# Patient Record
Sex: Male | Born: 1975 | Race: White | Hispanic: No | Marital: Single | Smoking: Current every day smoker
Health system: Southern US, Community
[De-identification: ages and names within clinical notes are randomized; demographics above are authoritative.]

## PROBLEM LIST (undated history)

## (undated) DIAGNOSIS — F419 Anxiety disorder, unspecified: Secondary | ICD-10-CM

## (undated) DIAGNOSIS — F32A Depression, unspecified: Secondary | ICD-10-CM

## (undated) DIAGNOSIS — F329 Major depressive disorder, single episode, unspecified: Secondary | ICD-10-CM

## (undated) DIAGNOSIS — E079 Disorder of thyroid, unspecified: Secondary | ICD-10-CM

---

## 2012-04-13 ENCOUNTER — Emergency Department (HOSPITAL_COMMUNITY)
Admission: EM | Admit: 2012-04-13 | Discharge: 2012-04-13 | Disposition: A | Discharge status: Home / Self Care | Payer: Self-pay | Attending: Emergency Medicine | Admitting: Emergency Medicine

## 2012-04-13 ENCOUNTER — Encounter (HOSPITAL_COMMUNITY): Payer: Self-pay

## 2012-04-13 DIAGNOSIS — Y929 Unspecified place or not applicable: Secondary | ICD-10-CM | POA: Insufficient documentation

## 2012-04-13 DIAGNOSIS — Z862 Personal history of diseases of the blood and blood-forming organs and certain disorders involving the immune mechanism: Secondary | ICD-10-CM | POA: Insufficient documentation

## 2012-04-13 DIAGNOSIS — Y9389 Activity, other specified: Secondary | ICD-10-CM | POA: Insufficient documentation

## 2012-04-13 DIAGNOSIS — Z8639 Personal history of other endocrine, nutritional and metabolic disease: Secondary | ICD-10-CM | POA: Insufficient documentation

## 2012-04-13 DIAGNOSIS — X500XXA Overexertion from strenuous movement or load, initial encounter: Secondary | ICD-10-CM | POA: Insufficient documentation

## 2012-04-13 DIAGNOSIS — S335XXA Sprain of ligaments of lumbar spine, initial encounter: Secondary | ICD-10-CM | POA: Insufficient documentation

## 2012-04-13 DIAGNOSIS — F17200 Nicotine dependence, unspecified, uncomplicated: Secondary | ICD-10-CM | POA: Insufficient documentation

## 2012-04-13 DIAGNOSIS — S39012A Strain of muscle, fascia and tendon of lower back, initial encounter: Secondary | ICD-10-CM

## 2012-04-13 HISTORY — DX: Disorder of thyroid, unspecified: E07.9

## 2012-04-13 MED ORDER — IBUPROFEN 400 MG PO TABS
800.0000 mg | ORAL_TABLET | Freq: Once | ORAL | Status: AC
  Administered 2012-04-13: 800 mg via ORAL
  Filled 2012-04-13: qty 2

## 2012-04-13 MED ORDER — HYDROCODONE-ACETAMINOPHEN 5-325 MG PO TABS
2.0000 | ORAL_TABLET | ORAL | Status: DC | PRN
Start: 2012-04-13 — End: 2015-08-31

## 2012-04-13 NOTE — ED Provider Notes (Signed)
History   This chart was scribed for Randy Sou, MD by Gerlean Ren, ED Scribe. This patient was seen in room TR07C/TR07C and the patient's care was started at 4:26 PM    CSN: [id number]  Arrival date & time 04/13/12  1348   First MD Initiated Contact with Patient 04/13/12 1625      Chief Complaint  Patient presents with  . Back Pain     The history is provided by the patient. No language interpreter was used.   Randy Webster is a 37 y.o. male who presents to the Emergency Department complaining of constant, mild, gradually worsening lower left back pain that occasionally radiates to the left knee with sudden onset this morning while moving a couch.  Pain worsened by movement and ambulation.  Has used tylenol and ibuprofen with minimal relief.  Pt denies incontinence.  Pt has no known allergies.  Pt is a current everyday smoker but denies alcohol use. Pain is worse with movement improved with rest. Mild at present  Past Medical History  Diagnosis Date  . Thyroid disease     History reviewed. No pertinent past surgical history.  History reviewed. No pertinent family history.  History  Substance Use Topics  . Smoking status: Current Every Day Smoker -- 0.5 packs/day    Types: Cigarettes  . Smokeless tobacco: Not on file  . Alcohol Use: No      Review of Systems  Constitutional: Negative.   HENT: Negative.   Respiratory: Negative.   Cardiovascular: Negative.   Gastrointestinal: Negative.   Musculoskeletal: Positive for back pain.  Skin: Negative.   Neurological: Negative.   Hematological: Negative.   Psychiatric/Behavioral: Negative.     Allergies  Review of patient's allergies indicates no known allergies.  Home Medications  No current outpatient prescriptions on file.  BP 112/76  Pulse 70  Temp 98 F (36.7 C) (Oral)  Resp 20  SpO2 98%  Physical Exam  Nursing note and vitals reviewed. Constitutional: He appears well-developed and well-nourished.  HENT:    Head: Normocephalic and atraumatic.  Eyes: Conjunctivae normal are normal. Pupils are equal, round, and reactive to light.  Neck: Neck supple. No tracheal deviation present. No thyromegaly present.  Cardiovascular: Normal rate and regular rhythm.   No murmur heard. Pulmonary/Chest: Effort normal and breath sounds normal.  Abdominal: Soft. Bowel sounds are normal. He exhibits no distension. There is no tenderness.  Musculoskeletal: Normal range of motion. He exhibits no edema and no tenderness.       Pain at lumbar area when standing from a supine position, no point tenderness.  Neurological: He is alert. He has normal reflexes. Coordination normal.       Gait normal  Skin: Skin is warm and dry. No rash noted.  Psychiatric: He has a normal mood and affect.    ED Course  Procedures (including critical care time) DIAGNOSTIC STUDIES: Oxygen Saturation is 98% on room air, normal by my interpretation.    COORDINATION OF CARE: 4:28 PM- Patient informed of clinical course, understands medical decision-making process, and agrees with plan.  Labs Reviewed - No data to display No results found.   No diagnosis found.    MDM  Imaging not indicated as patient has no red flexor back pain Plan prescription Norco. Referral urgent care center Diagnosis lumbar strain   I personally performed the services described in this documentation, which was scribed in my presence. The recorded information has been reviewed and is accurate.  Randy Sou, MD 04/13/12 [phone]

## 2012-04-13 NOTE — ED Notes (Signed)
Pt states he was trying to move his couch this morning and now has pain to his left lower back. NAD, ambulatory to triage

## 2015-02-13 ENCOUNTER — Encounter (HOSPITAL_COMMUNITY): Payer: Self-pay | Admitting: Family Medicine

## 2015-02-13 ENCOUNTER — Emergency Department (HOSPITAL_COMMUNITY): Payer: Self-pay

## 2015-02-13 ENCOUNTER — Emergency Department (HOSPITAL_COMMUNITY)
Admission: EM | Admit: 2015-02-13 | Discharge: 2015-02-13 | Disposition: A | Discharge status: Home / Self Care | Payer: Self-pay | Attending: Emergency Medicine | Admitting: Emergency Medicine

## 2015-02-13 DIAGNOSIS — J069 Acute upper respiratory infection, unspecified: Secondary | ICD-10-CM | POA: Insufficient documentation

## 2015-02-13 DIAGNOSIS — F17200 Nicotine dependence, unspecified, uncomplicated: Secondary | ICD-10-CM | POA: Insufficient documentation

## 2015-02-13 DIAGNOSIS — R112 Nausea with vomiting, unspecified: Secondary | ICD-10-CM | POA: Insufficient documentation

## 2015-02-13 DIAGNOSIS — Z8639 Personal history of other endocrine, nutritional and metabolic disease: Secondary | ICD-10-CM | POA: Insufficient documentation

## 2015-02-13 LAB — RAPID STREP SCREEN (MED CTR MEBANE ONLY): STREPTOCOCCUS, GROUP A SCREEN (DIRECT): NEGATIVE

## 2015-02-13 MED ORDER — AZITHROMYCIN 250 MG PO TABS: 250.0000 mg | ORAL_TABLET | Freq: Every day | ORAL | Status: DC

## 2015-02-13 NOTE — Discharge Instructions (Signed)
Mr. and Mrs. Schmid,  Nice meeting you! Please take your antibiotics as prescribed. Seek medical care if you develop fevers, have increasing throat pain, or do not improve despite antibiotics. I hope you feel better soon.  Ortencia KickS. Nicole Atasha Colebank, PA-C

## 2015-02-13 NOTE — ED Provider Notes (Signed)
CSN: [id number]     Arrival date & time 02/13/15  1106 History   First MD Initiated Contact with Patient 02/13/15 1157     Chief Complaint  Patient presents with  . Sore Throat  . Nausea  . Emesis   HPI   Randy Webster is a 39 y.o. M PMH significant for hypothyroidism presenting with a 2 day history of sore throat. He endorses fever (never measured), chills, nasal congestion, nonproductive cough, wheezing, nausea with vomiting (2 episodes a few days ago, NBNB) for two weeks. He states his nausea only occurs when he lays down. He denies HA, ear pain, abdominal pain, diarrhea, raw/undercooked foods, recent travel, ill contacts. Mucinex has not provided relief. No other alleviation attempts.  Past Medical History  Diagnosis Date  . Thyroid disease    History reviewed. No pertinent past surgical history. History reviewed. No pertinent family history. Social History  Substance Use Topics  . Smoking status: Current Every Day Smoker -- 0.50 packs/day    Types: Cigarettes  . Smokeless tobacco: None  . Alcohol Use: No    Review of Systems  Ten systems are reviewed and are negative for acute change except as noted in the HPI  Allergies  Review of patient's allergies indicates no known allergies.  Home Medications   Prior to Admission medications   Medication Sig Start Date End Date Taking? Authorizing Provider  acetaminophen (TYLENOL) 500 MG tablet Take 500 mg by mouth every 6 (six) hours as needed. For pain    Historical Provider, MD  azithromycin (ZITHROMAX) 250 MG tablet Take 1 tablet (250 mg total) by mouth daily. Take first 2 tablets together, then 1 every day until finished. 02/13/15   Melton Krebs, PA-C  HYDROcodone-acetaminophen (NORCO/VICODIN) 5-325 MG per tablet Take 2 tablets by mouth every 4 (four) hours as needed for pain. 04/13/12   Doug Sou, MD  ibuprofen (ADVIL,MOTRIN) 200 MG tablet Take 600 mg by mouth every 6 (six) hours as needed. For pain    Historical  Provider, MD   BP 115/86 mmHg  Pulse 74  Temp(Src) 98.5 F (36.9 C)  Resp 18  Wt 204 lb (92.534 kg)  SpO2 97% Physical Exam  Constitutional: He appears well-developed and well-nourished. No distress.  HENT:  Head: Normocephalic and atraumatic.  Right Ear: External ear normal.  Left Ear: External ear normal.  Mouth/Throat: Oropharynx is clear and moist. No oropharyngeal exudate.  Swollen, erythematous turbinates bilaterally. Oropharynx erythematous without exudate. Airway intact.   Eyes: Conjunctivae are normal. Pupils are equal, round, and reactive to light. Right eye exhibits no discharge. Left eye exhibits no discharge. No scleral icterus.  Neck: Normal range of motion. No tracheal deviation present.  Cardiovascular: Normal rate, regular rhythm, normal heart sounds and intact distal pulses.  Exam reveals no gallop and no friction rub.   No murmur heard. Pulmonary/Chest: Effort normal and breath sounds normal. No respiratory distress. He has no wheezes. He has no rales. He exhibits no tenderness.  Abdominal: Soft. Bowel sounds are normal. He exhibits no distension and no mass. There is no tenderness. There is no rebound and no guarding.  Musculoskeletal: He exhibits no edema.  Lymphadenopathy:    He has no cervical adenopathy.  Neurological: He is alert. Coordination normal.  Skin: Skin is warm and dry. No rash noted. He is not diaphoretic. No erythema.  Psychiatric: He has a normal mood and affect. His behavior is normal.  Nursing note and vitals reviewed.   ED Course  Procedures  Labs Review Labs Reviewed  RAPID STREP SCREEN (NOT AT [facility])  CULTURE, GROUP A STREP   Imaging Review Dg Chest 2 View  02/13/2015  CLINICAL DATA:  Cough for 3 days with shortness of Breath EXAM: CHEST - 2 VIEW COMPARISON:  None. FINDINGS: The heart size and mediastinal contours are within normal limits. Both lungs are clear. The visualized skeletal structures are unremarkable. IMPRESSION: No  active disease. Electronically Signed   By: Alcide CleverMark  Lukens M.D.   On: 02/13/2015 13:48   I have personally reviewed and evaluated these images and lab results as part of my medical decision-making.  MDM   Final diagnoses:  Upper respiratory infection   Patient non-toxic appearing with stable vital signs. Patient admits to smoking history, and due to longevity of symptoms, will get a CXR. Most likely URI. No stridor, respiratory distress, drooling, muffled voice, masses visualized in pharynx. Supraglottitis/epiglottitis, pharyngeal abscess less likely.   Reviewed RN note regarding non-compliance with thyroid medication. Patient has been off of his thyroid medication (thinks it is synthroid), for a year and a half due to lack of steady income. I am uncomfortable prescribing anything for this, it is not an acute issue, and TSH testing is not indicated during this visit. Encouraged patient to follow-up with PCP regarding future work-up.  CXR negative for acute process. Discussed results with patient. Due to longevity of symptoms and smoking history, will prescribe z-pack. Advised ibuprofen and Tylenol for pain management. Patient may be safely discharged home. Discussed reasons for return. Patient to follow-up with primary care provider within one week. Patient in understanding and agreement with the plan.  Melton KrebsSamantha Nicole Rhanda Lemire, PA-C 02/15/15 [id number]  Geoffery Lyonsouglas Delo, MD 02/18/15 [phone]

## 2015-02-13 NOTE — ED Notes (Signed)
Pt here for sore throat with N,V. sts he has been off his thyroid meds.

## 2015-02-13 NOTE — ED Notes (Signed)
Pt placed on monitor upon return to room from radiology. Pt remains monitored by blood pressure and pulse ox. Pts family remains at bedside.  

## 2015-02-15 LAB — CULTURE, GROUP A STREP: Strep A Culture: NEGATIVE

## 2015-08-16 ENCOUNTER — Emergency Department (HOSPITAL_COMMUNITY)
Admission: EM | Admit: 2015-08-16 | Discharge: 2015-08-16 | Disposition: A | Discharge status: Home / Self Care | Payer: Self-pay | Attending: Emergency Medicine | Admitting: Emergency Medicine

## 2015-08-16 ENCOUNTER — Encounter (HOSPITAL_COMMUNITY): Payer: Self-pay | Admitting: Emergency Medicine

## 2015-08-16 DIAGNOSIS — Z8639 Personal history of other endocrine, nutritional and metabolic disease: Secondary | ICD-10-CM | POA: Insufficient documentation

## 2015-08-16 DIAGNOSIS — X58XXXA Exposure to other specified factors, initial encounter: Secondary | ICD-10-CM | POA: Insufficient documentation

## 2015-08-16 DIAGNOSIS — Y9289 Other specified places as the place of occurrence of the external cause: Secondary | ICD-10-CM | POA: Insufficient documentation

## 2015-08-16 DIAGNOSIS — Y998 Other external cause status: Secondary | ICD-10-CM | POA: Insufficient documentation

## 2015-08-16 DIAGNOSIS — F17210 Nicotine dependence, cigarettes, uncomplicated: Secondary | ICD-10-CM | POA: Insufficient documentation

## 2015-08-16 DIAGNOSIS — Y9389 Activity, other specified: Secondary | ICD-10-CM | POA: Insufficient documentation

## 2015-08-16 DIAGNOSIS — S39012A Strain of muscle, fascia and tendon of lower back, initial encounter: Secondary | ICD-10-CM | POA: Insufficient documentation

## 2015-08-16 MED ORDER — METHOCARBAMOL 500 MG PO TABS: 500.0000 mg | ORAL_TABLET | Freq: Two times a day (BID) | ORAL | Status: DC

## 2015-08-16 MED ORDER — IBUPROFEN 800 MG PO TABS: 800.0000 mg | ORAL_TABLET | Freq: Three times a day (TID) | ORAL | Status: DC

## 2015-08-16 NOTE — Discharge Instructions (Signed)

## 2015-08-16 NOTE — ED Provider Notes (Signed)
History  By signing my name below, I, Randy Webster, attest that this documentation has been prepared under the direction and in the presence of Randy Helper, PA-C. Electronically Signed: Earmon Webster, ED Scribe. 08/16/2015. 2:34 PM.  Chief Complaint  Patient presents with  . Back Pain   The history is provided by the patient and medical records. No language interpreter was used.    HPI Comments:  Randy Webster is a 40 y.o. male who presents to the Emergency Department complaining of right-sided lower back pain that began two days ago. He states the pain radiates down his RLE to the knee. Pt states the day before the pain began he moved a dresser a very short distance. He has taken OTC Ibuprofen 3-4 tablets every 4-6 hours with minimal relief of the pain. Movement increases the pain. He denies alleviating factors. He denies difficulty urinating, dysuria, hematuria, fever, chills, abdominal pain, nausea, vomiting, abdominal pain, bowel or bladder incontinence, numbness, tingling or weakness of the lower extremities, saddle anesthesia. He reports back pain in the past that was controlled with OTC medications. He denies h/o kidney stones, cancer or IV drug use.  Past Medical History  Diagnosis Date  . Thyroid disease    History reviewed. No pertinent past surgical history. No family history on file. Social History  Substance Use Topics  . Smoking status: Current Every Day Smoker -- 0.50 packs/day    Types: Cigarettes  . Smokeless tobacco: None  . Alcohol Use: No    Review of Systems  Constitutional: Negative for fever and chills.  Gastrointestinal: Negative for nausea, vomiting and abdominal pain.  Genitourinary: Negative for dysuria, hematuria and difficulty urinating.  Musculoskeletal: Positive for back pain.  Skin: Negative for color change and wound.  Neurological: Negative for weakness and numbness.    Allergies  Review of patient's allergies indicates no known  allergies.  Home Medications   Prior to Admission medications   Medication Sig Start Date End Date Taking? Authorizing Provider  acetaminophen (TYLENOL) 500 MG tablet Take 500 mg by mouth every 6 (six) hours as needed. For pain    Historical Provider, MD  azithromycin (ZITHROMAX) 250 MG tablet Take 1 tablet (250 mg total) by mouth daily. Take first 2 tablets together, then 1 every day until finished. 02/13/15   Melton Krebs, PA-C  HYDROcodone-acetaminophen (NORCO/VICODIN) 5-325 MG per tablet Take 2 tablets by mouth every 4 (four) hours as needed for pain. 04/13/12   Doug Sou, MD  ibuprofen (ADVIL,MOTRIN) 200 MG tablet Take 600 mg by mouth every 6 (six) hours as needed. For pain    Historical Provider, MD   Triage Vitals: BP 140/95 mmHg  Pulse 87  Temp(Src) 98.5 F (36.9 C) (Oral)  Resp 18  SpO2 98% Physical Exam  Constitutional: He is oriented to person, place, and time. He appears well-developed and well-nourished.  HENT:  Head: Normocephalic and atraumatic.  Eyes: EOM are normal.  Neck: Normal range of motion.  Cardiovascular: Normal rate.   Pulmonary/Chest: Effort normal.  Abdominal: Soft. There is no tenderness. There is no CVA tenderness.  Musculoskeletal: Normal range of motion.  No significant spine tenderness, crepitus or step offs. Right paraspinal lumbar tenderness to palpation. Skin normal. Equal strength to BLE. No palpable cords, erythema or edema. Negative SLR bilaterally.  Neurological: He is alert and oriented to person, place, and time.  Patellar reflexes intact bilaterally.  Skin: Skin is warm and dry.  Psychiatric: He has a normal mood and affect. His behavior  is normal.  Nursing note and vitals reviewed.   ED Course  Procedures (including critical care time) DIAGNOSTIC STUDIES: Oxygen Saturation is 98% on RA, normal by my interpretation.   COORDINATION OF CARE: 2:27 PM- low back strain.  No red flags. Ambulate without difficulty.  NVI.  Will  prescribe muscle relaxer and give referral to orthopedist. Pt verbalizes understanding and agrees to plan.   MDM   Final diagnoses:  Low back strain, initial encounter    BP 140/95 mmHg  Pulse 87  Temp(Src) 98.5 F (36.9 C) (Oral)  Resp 18  SpO2 98%   I personally performed the services described in this documentation, which was scribed in my presence. The recorded information has been reviewed and is accurate.       Randy HelperBowie Aidenn Skellenger, PA-C 08/16/15 1439  Melene Planan Floyd, DO 08/16/15 1506

## 2015-08-16 NOTE — ED Notes (Signed)
Pt reports right side lower back pain x 2 days. Pt alert x4.

## 2015-08-31 ENCOUNTER — Emergency Department (HOSPITAL_COMMUNITY)
Admission: EM | Admit: 2015-08-31 | Discharge: 2015-08-31 | Disposition: A | Discharge status: Home / Self Care | Payer: Self-pay | Attending: Emergency Medicine | Admitting: Emergency Medicine

## 2015-08-31 ENCOUNTER — Emergency Department (HOSPITAL_COMMUNITY): Payer: Self-pay

## 2015-08-31 ENCOUNTER — Encounter (HOSPITAL_COMMUNITY): Payer: Self-pay | Admitting: Emergency Medicine

## 2015-08-31 DIAGNOSIS — Z79899 Other long term (current) drug therapy: Secondary | ICD-10-CM | POA: Insufficient documentation

## 2015-08-31 DIAGNOSIS — N201 Calculus of ureter: Secondary | ICD-10-CM

## 2015-08-31 DIAGNOSIS — F17210 Nicotine dependence, cigarettes, uncomplicated: Secondary | ICD-10-CM | POA: Insufficient documentation

## 2015-08-31 DIAGNOSIS — N132 Hydronephrosis with renal and ureteral calculous obstruction: Secondary | ICD-10-CM | POA: Insufficient documentation

## 2015-08-31 DIAGNOSIS — N1330 Unspecified hydronephrosis: Secondary | ICD-10-CM

## 2015-08-31 LAB — I-STAT CHEM 8, ED
BUN: 11 mg/dL (ref 6–20)
CALCIUM ION: 1.12 mmol/L (ref 1.12–1.23)
CHLORIDE: 102 mmol/L (ref 101–111)
CREATININE: 1.8 mg/dL — AB (ref 0.61–1.24)
GLUCOSE: 85 mg/dL (ref 65–99)
HCT: 35 % — ABNORMAL LOW (ref 39.0–52.0)
Hemoglobin: 11.9 g/dL — ABNORMAL LOW (ref 13.0–17.0)
Potassium: 3.8 mmol/L (ref 3.5–5.1)
Sodium: 138 mmol/L (ref 135–145)
TCO2: 24 mmol/L (ref 0–100)

## 2015-08-31 LAB — URINE MICROSCOPIC-ADD ON

## 2015-08-31 LAB — URINALYSIS, ROUTINE W REFLEX MICROSCOPIC
Glucose, UA: NEGATIVE mg/dL
Leukocytes, UA: NEGATIVE
NITRITE: NEGATIVE
PROTEIN: 30 mg/dL — AB
SPECIFIC GRAVITY, URINE: 1.028 (ref 1.005–1.030)
pH: 5.5 (ref 5.0–8.0)

## 2015-08-31 MED ORDER — MORPHINE SULFATE (PF) 4 MG/ML IV SOLN
4.0000 mg | Freq: Once | INTRAVENOUS | Status: AC
  Administered 2015-08-31: 4 mg via INTRAVENOUS
  Filled 2015-08-31: qty 1

## 2015-08-31 MED ORDER — SODIUM CHLORIDE 0.9 % IV BOLUS (SEPSIS)
1000.0000 mL | Freq: Once | INTRAVENOUS | Status: AC
  Administered 2015-08-31: 1000 mL via INTRAVENOUS

## 2015-08-31 MED ORDER — OXYCODONE-ACETAMINOPHEN 5-325 MG PO TABS: 1.0000 | ORAL_TABLET | ORAL | Status: DC | PRN

## 2015-08-31 MED ORDER — TAMSULOSIN HCL 0.4 MG PO CAPS: 0.4000 mg | ORAL_CAPSULE | Freq: Every day | ORAL | Status: DC

## 2015-08-31 MED ORDER — IBUPROFEN 800 MG PO TABS: 800.0000 mg | ORAL_TABLET | Freq: Three times a day (TID) | ORAL | Status: DC | PRN

## 2015-08-31 MED ORDER — HYDROMORPHONE HCL 1 MG/ML IJ SOLN
0.5000 mg | Freq: Once | INTRAMUSCULAR | Status: AC
  Administered 2015-08-31: 0.5 mg via INTRAVENOUS
  Filled 2015-08-31: qty 1

## 2015-08-31 MED ORDER — ONDANSETRON HCL 4 MG PO TABS: 4.0000 mg | ORAL_TABLET | Freq: Four times a day (QID) | ORAL | Status: DC

## 2015-08-31 MED ORDER — KETOROLAC TROMETHAMINE 30 MG/ML IJ SOLN
30.0000 mg | Freq: Once | INTRAMUSCULAR | Status: AC
  Administered 2015-08-31: 30 mg via INTRAVENOUS
  Filled 2015-08-31: qty 1

## 2015-08-31 NOTE — ED Provider Notes (Signed)
CSN: [id number]     Arrival date & time 08/31/15  1011 History   First MD Initiated Contact with Patient 08/31/15 1032     Chief Complaint  Patient presents with  . Flank Pain     (Consider location/radiation/quality/duration/timing/severity/associated sxs/prior Treatment) HPI Comments: 40 year old male with history of kidney stones presents for right flank pain. The patient reports that he's been having pain in this area over the last 3 days. He says he was hoping that it would pass on its own as he has had kidney stones in the past that passed on their own. Denies fever or chills. Has had some nausea but no vomiting. No diarrhea or constipation.   Past Medical History  Diagnosis Date  . Thyroid disease    History reviewed. No pertinent past surgical history. History reviewed. No pertinent family history. Social History  Substance Use Topics  . Smoking status: Current Every Day Smoker -- 0.50 packs/day    Types: Cigarettes  . Smokeless tobacco: None  . Alcohol Use: No    Review of Systems  Constitutional: Negative for fever, chills, appetite change and fatigue.  HENT: Negative for congestion, postnasal drip, rhinorrhea and sinus pressure.   Eyes: Negative for visual disturbance.  Respiratory: Negative for cough, chest tightness and shortness of breath.   Cardiovascular: Negative for chest pain and palpitations.  Gastrointestinal: Positive for nausea. Negative for vomiting, abdominal pain and diarrhea.  Genitourinary: Positive for flank pain. Negative for urgency, frequency and hematuria.  Musculoskeletal: Negative for myalgias and back pain.  Skin: Negative for rash.  Neurological: Negative for dizziness, weakness and headaches.  Hematological: Does not bruise/bleed easily.      Allergies  Review of patient's allergies indicates no known allergies.  Home Medications   Prior to Admission medications   Medication Sig Start Date End Date Taking? Authorizing Provider   ibuprofen (ADVIL,MOTRIN) 800 MG tablet Take 1 tablet (800 mg total) by mouth every 8 (eight) hours as needed for moderate pain. 08/31/15   Leta Baptist, MD  methocarbamol (ROBAXIN) 500 MG tablet Take 1 tablet (500 mg total) by mouth 2 (two) times daily. Patient not taking: Reported on 08/31/2015 08/16/15   Fayrene Helper, PA-C  oxyCODONE-acetaminophen (PERCOCET/ROXICET) 5-325 MG tablet Take 1-2 tablets by mouth every 4 (four) hours as needed for moderate pain or severe pain. 08/31/15   Leta Baptist, MD  tamsulosin (FLOMAX) 0.4 MG CAPS capsule Take 1 capsule (0.4 mg total) by mouth daily. 08/31/15   Leta Baptist, MD   BP 140/90 mmHg  Pulse 62  Temp(Src) 98.4 F (36.9 C) (Oral)  Resp 18  SpO2 98% Physical Exam  Constitutional: He is oriented to person, place, and time. He appears well-developed and well-nourished. No distress.  HENT:  Head: Normocephalic and atraumatic.  Right Ear: External ear normal.  Left Ear: External ear normal.  Mouth/Throat: Oropharynx is clear and moist. No oropharyngeal exudate.  Eyes: EOM are normal. Pupils are equal, round, and reactive to light.  Neck: Normal range of motion. Neck supple.  Cardiovascular: Normal rate, regular rhythm, normal heart sounds and intact distal pulses.   No murmur heard. Pulmonary/Chest: Effort normal. No respiratory distress. He has no wheezes. He has no rales.  Abdominal: Soft. He exhibits no distension. There is no tenderness.  Musculoskeletal: He exhibits no edema.  Neurological: He is alert and oriented to person, place, and time.  Skin: Skin is warm and dry. No rash noted. He is not diaphoretic.  Vitals reviewed.  ED Course  Procedures (including critical care time) Labs Review Labs Reviewed  URINALYSIS, ROUTINE W REFLEX MICROSCOPIC (NOT AT [facility]) - Abnormal; Notable for the following:    APPearance CLOUDY (*)    Hgb urine dipstick LARGE (*)    Bilirubin Urine SMALL (*)    Ketones, ur >80 (*)    Protein, ur 30 (*)     All other components within normal limits  URINE MICROSCOPIC-ADD ON - Abnormal; Notable for the following:    Squamous Epithelial / LPF 0-5 (*)    Bacteria, UA RARE (*)    All other components within normal limits  I-STAT CHEM 8, ED - Abnormal; Notable for the following:    Creatinine, Ser 1.80 (*)    Hemoglobin 11.9 (*)    HCT 35.0 (*)    All other components within normal limits    Imaging Review US Renal  08/31/2015  CLINICAL DATA:  Acute right flank pain. EXAM: RENAL / URINARY TRACT ULTRASOUND COMPLETE COMPARISON:  None. FINDINGS: Right Kidney: Length: 13 cm. Echogenicity within normal limits. No mass visualized. Moderate hydronephrosis is noted. No obstructing calculus is seen. Left Kidney: Length: 12.9 cm. 5 mm nonobstructive calculus is seen in midpole calyx. Echogenicity within normal limits. No mass or hydronephrosis visualized. Bladder: Appears normal for degree of bladder distention. IMPRESSION: Moderate right hydronephrosis. Nonobstructive left renal calculus. CT urogram is recommended for further evaluation. Electronically Signed   By: Lupita Raider, M.D.   On: 08/31/2015 12:47   Ct Renal Stone Study  08/31/2015  CLINICAL DATA:  Acute onset right flank pain in patient with history of urinary tract stones. Initial encounter. EXAM: CT ABDOMEN AND PELVIS WITHOUT CONTRAST TECHNIQUE: Multidetector CT imaging of the abdomen and pelvis was performed following the standard protocol without IV contrast. COMPARISON:  None. FINDINGS: A 0.6 cm nodule is seen in the right middle lobe on image 8. Dependent bibasilar atelectasis is noted. No pleural or pericardial effusion. The patient has multiple bilateral nonobstructing renal stones measuring up to 0.6 cm in the left kidney. Moderate to moderately severe right hydronephrosis is identified due to a 0.5 cm stone just below the right ureteropelvic junction. No other obstructing stones are identified. The urinary bladder is unremarkable. The  gallbladder, liver, spleen, adrenal glands and pancreas appear normal. Aortoiliac atherosclerosis without aneurysm is identified. There is no lymphadenopathy or fluid. The stomach, small and large bowel and appendix appear normal. Schmorl's nodes are noted in the lower thoracic and upper lumbar spine. No worrisome bony lesion is identified. IMPRESSION: Moderate to moderately severe right hydronephrosis due to a 0.5 cm stone just below the right ureteropelvic junction. Bilateral nonobstructing renal stones measuring up to 0.6 cm. 0.6 cm nodule in the right middle lobe. Non-contrast chest CT at 6-12 months is recommended. If the nodule is stable at time of repeat CT, then future CT at 18-24 months (from today's scan) is considered optional for low-risk patients, but is recommended for high-risk patients. This recommendation follows the consensus statement: Guidelines for Management of Incidental Pulmonary Nodules Detected on CT Images:From the Fleischner Society 2017; published online before print (10.1148/radiol.1610960454). Advanced for age aortoiliac atherosclerosis. Electronically Signed   By: Drusilla Kanner M.D.   On: 08/31/2015 14:09   I have personally reviewed and evaluated these images and lab results as part of my medical decision-making.   EKG Interpretation None      MDM  Patient was seen and evaluated in stable condition. Benign examination. History concerning for kidney  stone. Chem 8 with creatinine of 1.8. No previous creatinines to compare this to. Urine not consistent with infection. Ultrasound with moderate Hydro. Follow-up CT with 5 mm obstructing stone. Case discussed briefly on the phone with urologist on-call who agreed with plan for discharge with pain control. The urologist also recommended the patient follow up with the resident clinic at at their office. This was discussed with the patient since finances will be an issue for him. Discussed all results and plan of care with patient  and his family at bedside. They expressed understanding and agreement. Final diagnoses:  Ureteral calculus  Hydronephrosis, right    1. Renal colic  2. Ureteral stone  3. Hydronephrosis    Leta BaptistEmily Roe Curstin Schmale, MD 08/31/15 1453

## 2015-08-31 NOTE — ED Notes (Signed)
Bed: [id number] Expected date:  Expected time:  Means of arrival:  Comments: 1F/abd pain/hx of diverticulitis

## 2015-08-31 NOTE — ED Notes (Signed)
MD at bedside. 

## 2015-08-31 NOTE — ED Notes (Signed)
Delay in blood draw-patient in ultrasound

## 2015-08-31 NOTE — Discharge Instructions (Signed)
You were seen and evaluated today for your right-sided pain. This is secondary to a kidney stone that is blocking outflow of urine from your kidney. You need to follow-up with the urologist outpatient. Use the pain medication prescribed. The urology clinic has a resident clinic that you should arrange follow-up with as it apparently will not cause any money. Return with fever, intractable vomiting.   Renal Colic Renal colic is pain that is caused by passing a kidney stone. The pain can be sharp and severe. It may be felt in the back, abdomen, side (flank), or groin. It can cause nausea. Renal colic can come and go. HOME CARE INSTRUCTIONS Watch your condition for any changes. The following actions may help to lessen any discomfort that you are feeling:  Take medicines only as directed by your health care provider.  Ask your health care provider if it is okay to take over-the-counter pain medicine.  Drink enough fluid to keep your urine clear or pale yellow. Drink 6-8 glasses of water each day.  Limit the amount of salt that you eat to less than 2 grams per day.  Reduce the amount of protein in your diet. Eat less meat, fish, nuts, and dairy.  Avoid foods such as spinach, rhubarb, nuts, or bran. These may make kidney stones more likely to form. SEEK MEDICAL CARE IF:  You have a fever or chills.  Your urine smells bad or looks cloudy.  You have pain or burning when you pass urine. SEEK IMMEDIATE MEDICAL CARE IF:  Your flank pain or groin pain suddenly worsens.  You become confused or disoriented or you lose consciousness.   This information is not intended to replace advice given to you by your health care provider. Make sure you discuss any questions you have with your health care provider.   Document Released: 12/26/2004 Document Revised: 04/08/2014 Document Reviewed: 01/26/2014 Elsevier Interactive Patient Education [employer]

## 2015-08-31 NOTE — ED Notes (Signed)
Hx of kidney stones, right lower flank pain, no blood in urine but urine has been dark recently, says he feels like its a kidney stone. C/o nausea and vomiting, denies diarrhea.

## 2015-08-31 NOTE — ED Notes (Signed)
Patient remains in ultrasound.

## 2017-10-11 ENCOUNTER — Encounter (HOSPITAL_COMMUNITY): Payer: Self-pay | Admitting: Emergency Medicine

## 2017-10-11 ENCOUNTER — Emergency Department (HOSPITAL_COMMUNITY): Payer: Self-pay

## 2017-10-11 ENCOUNTER — Emergency Department (HOSPITAL_COMMUNITY)
Admission: EM | Admit: 2017-10-11 | Discharge: 2017-10-11 | Disposition: A | Discharge status: Home / Self Care | Payer: Self-pay | Attending: Emergency Medicine | Admitting: Emergency Medicine

## 2017-10-11 DIAGNOSIS — Z79899 Other long term (current) drug therapy: Secondary | ICD-10-CM | POA: Insufficient documentation

## 2017-10-11 DIAGNOSIS — R0609 Other forms of dyspnea: Secondary | ICD-10-CM | POA: Insufficient documentation

## 2017-10-11 DIAGNOSIS — R600 Localized edema: Secondary | ICD-10-CM

## 2017-10-11 DIAGNOSIS — F17210 Nicotine dependence, cigarettes, uncomplicated: Secondary | ICD-10-CM | POA: Insufficient documentation

## 2017-10-11 DIAGNOSIS — R2243 Localized swelling, mass and lump, lower limb, bilateral: Secondary | ICD-10-CM | POA: Insufficient documentation

## 2017-10-11 LAB — CBC
HEMATOCRIT: 40.8 % (ref 39.0–52.0)
HEMOGLOBIN: 14.1 g/dL (ref 13.0–17.0)
MCH: 33.3 pg (ref 26.0–34.0)
MCHC: 34.6 g/dL (ref 30.0–36.0)
MCV: 96.2 fL (ref 78.0–100.0)
Platelets: 217 10*3/uL (ref 150–400)
RBC: 4.24 MIL/uL (ref 4.22–5.81)
RDW: 14.8 % (ref 11.5–15.5)
WBC: 8.1 10*3/uL (ref 4.0–10.5)

## 2017-10-11 LAB — COMPREHENSIVE METABOLIC PANEL
ALBUMIN: 3.1 g/dL — AB (ref 3.5–5.0)
ALK PHOS: 29 U/L — AB (ref 38–126)
ALT: 32 U/L (ref 0–44)
AST: 39 U/L (ref 15–41)
Anion gap: 6 (ref 5–15)
BILIRUBIN TOTAL: 0.5 mg/dL (ref 0.3–1.2)
BUN: 13 mg/dL (ref 6–20)
CALCIUM: 8.1 mg/dL — AB (ref 8.9–10.3)
CO2: 25 mmol/L (ref 22–32)
CREATININE: 1.25 mg/dL — AB (ref 0.61–1.24)
Chloride: 110 mmol/L (ref 98–111)
GFR calc Af Amer: >60 mL/min (ref >60)
GFR calc non Af Amer: >60 mL/min (ref >60)
GLUCOSE: 77 mg/dL (ref 70–99)
Potassium: 3.1 mmol/L — ABNORMAL LOW (ref 3.5–5.1)
Sodium: 141 mmol/L (ref 135–145)
TOTAL PROTEIN: 5.3 g/dL — AB (ref 6.5–8.1)

## 2017-10-11 LAB — TSH: TSH: 171.275 u[IU]/mL — ABNORMAL HIGH (ref 0.350–4.500)

## 2017-10-11 LAB — BRAIN NATRIURETIC PEPTIDE: B NATRIURETIC PEPTIDE 5: 28.9 pg/mL (ref 0.0–100.0)

## 2017-10-11 LAB — TROPONIN I: Troponin I: <0.03 ng/mL (ref <0.03)

## 2017-10-11 MED ORDER — FUROSEMIDE 40 MG PO TABS
20.0000 mg | ORAL_TABLET | Freq: Once | ORAL | Status: AC
  Administered 2017-10-11: 20 mg via ORAL
  Filled 2017-10-11: qty 1

## 2017-10-11 MED ORDER — POTASSIUM CHLORIDE CRYS ER 20 MEQ PO TBCR: 20.0000 meq | EXTENDED_RELEASE_TABLET | Freq: Two times a day (BID) | ORAL | 0 refills | Status: DC

## 2017-10-11 MED ORDER — FUROSEMIDE 20 MG PO TABS: 20.0000 mg | ORAL_TABLET | Freq: Every day | ORAL | 0 refills | Status: DC

## 2017-10-11 MED ORDER — POTASSIUM CHLORIDE CRYS ER 20 MEQ PO TBCR
40.0000 meq | EXTENDED_RELEASE_TABLET | Freq: Once | ORAL | Status: AC
  Administered 2017-10-11: 40 meq via ORAL
  Filled 2017-10-11: qty 2

## 2017-10-11 NOTE — Discharge Instructions (Addendum)
Thank you for allowing me to care for you today in the Emergency Department.   Starting tomorrow, take one tablet of Lasix daily as needed for leg swelling. Take 2 tablets of potassium chloride daily.   Follow up with Chales AbrahamsMary Ann Placey this week.   Return to the Emergency Department with if you develop chest pain or shortness of breath that does not improve with rest, if one legs get red and hot to the touch, vomiting, or other new, concerning symptoms.

## 2017-10-11 NOTE — ED Provider Notes (Signed)
[facility] DEPT Provider Note   CSN: [id number] Arrival date & time: 10/11/17  1546     History   Chief Complaint Chief Complaint  Patient presents with  . Leg Swelling  . Leg Pain    HPI Randy Webster is a 42 y.o. male with a history of thyroid disease who presents to the emergency department with a chief complaint of bilateral leg swelling.  The patient endorses bilateral swelling to the lower extremities and dyspnea on exertion that has been gradually worsening since onset in November 2018.  Reports that dyspnea is only present with exertion after approximately 2 blocks.  No change in distance with onset of exertion.  Symptoms resolve with rest.   He reports that he and his wife are currently homeless.  He is not established with a PCP.  He denies chest pain, palpitations, fever, chills, numbness, weakness, abdominal pain, headache, dizziness, or lightheadedness.  No orthopnea.  No recent abdominal distention or weight gain.  He states that he is actually lost some weight as he has not been eating as well since they became homeless.  He reports that he was seen at a hospital in [place] in 2013 and had a cardiac catheterization performed.  No stents or CABG were performed at that time.  He is unable to recall the cardiologist that performed to the procedure.  He has not been seen by cardiology since that time.  He does report multiple family members with CAD.  His wife also notes that it seems that he has had snoring and some brief periods where it appears as if he is not breathing when he sleeps at night.  No problems with apnea during the day.  His wife also states that there are some areas on his legs where he no longer has hair. He denies pain in his legs.  No history of claudication.  Is a current, every day smoker.  He reports that he was previously taking levothyroxine for thyroid disease, but has not taken the medication for years.  He currently takes  no daily medications.  He reports infrequent and minimal alcohol use.  Last alcohol use was 3 beers 2 weeks ago.  The history is provided by the patient. No language interpreter was used.    Past Medical History:  Diagnosis Date  . Thyroid disease     There are no active problems to display for this patient.   History reviewed. No pertinent surgical history.      Home Medications    Prior to Admission medications   Medication Sig Start Date End Date Taking? Authorizing Provider  camphor-menthol Timoteo Ace) lotion Apply 1 application topically 2 (two) times daily.   Yes [provider]  ibuprofen (ADVIL,MOTRIN) 200 MG tablet Take 800 mg by mouth every 4 (four) hours as needed for mild pain.   Yes [provider]  furosemide (LASIX) 20 MG tablet Take 1 tablet (20 mg total) by mouth daily. 10/11/17   Damani Kelemen A, PA-C  potassium chloride SA (K-DUR,KLOR-CON) 20 MEQ tablet Take 1 tablet (20 mEq total) by mouth 2 (two) times daily for 7 days. 10/11/17 10/18/17  Britain Anagnos, Maree Erie A, PA-C    Family History No family history on file.  Social History Social History   Tobacco Use  . Smoking status: Current Every Day Smoker    Packs/day: 0.50    Types: Cigarettes  . Smokeless tobacco: Never Used  Substance Use Topics  . Alcohol use: No  .  Drug use: No     Allergies   Patient has no known allergies.   Review of Systems Review of Systems  Constitutional: Negative for appetite change, fever and unexpected weight change.  Respiratory: Positive for shortness of breath.   Cardiovascular: Positive for leg swelling. Negative for chest pain and palpitations.  Gastrointestinal: Negative for abdominal pain, diarrhea, nausea and vomiting.  Genitourinary: Negative for dysuria.  Musculoskeletal: Negative for back pain and myalgias.  Skin: Negative for rash.  Allergic/Immunologic: Negative for immunocompromised state.  Neurological: Negative for dizziness, syncope,  weakness, numbness and headaches.  Psychiatric/Behavioral: Negative for confusion.   Physical Exam Updated Vital Signs BP (!) 131/96   Pulse 69   Temp (!) 97.2 F (36.2 C) (Oral)   Resp 20   SpO2 96%   Physical Exam  Constitutional: He appears well-developed. No distress.  Well-appearing, no acute distress.  HENT:  Head: Normocephalic.  Eyes: Pupils are equal, round, and reactive to light. Conjunctivae and EOM are normal. Right eye exhibits no discharge. Left eye exhibits no discharge. No scleral icterus.  Neck: Neck supple.  Cardiovascular: Normal rate, regular rhythm, normal heart sounds and intact distal pulses. Exam reveals no gallop and no friction rub.  No murmur heard. Pulmonary/Chest: Effort normal and breath sounds normal. No stridor. No respiratory distress. He has no wheezes. He has no rales. He exhibits no tenderness.  Lungs are clear to auscultation bilaterally.  Speaks in complete, fluent sentences.  No increased work of breathing, accessory muscle use, retractions.  Abdominal: Soft. He exhibits no distension and no mass. There is no tenderness. There is no rebound and no guarding. No hernia.  Abdomen is minimally distended, but soft.  Nontender to palpation.   Musculoskeletal: Normal range of motion. He exhibits edema. He exhibits no tenderness or deformity.  2+ pitting edema in the bilateral lower extremities that does not extend past the mid-calf.   Neurological: He is alert.  Skin: Skin is warm and dry. He is not diaphoretic.  Psychiatric: His behavior is normal.  Nursing note and vitals reviewed.  ED Treatments / Results  Labs (all labs ordered are listed, but only abnormal results are displayed) Labs Reviewed  COMPREHENSIVE METABOLIC PANEL - Abnormal; Notable for the following components:      Result Value   Potassium 3.1 (*)    Creatinine, Ser 1.25 (*)    Calcium 8.1 (*)    Total Protein 5.3 (*)    Albumin 3.1 (*)    Alkaline Phosphatase 29 (*)    All  other components within normal limits  TSH - Abnormal; Notable for the following components:   TSH 171.275 (*)    All other components within normal limits  BRAIN NATRIURETIC PEPTIDE  CBC  TROPONIN I  T3, FREE  T4, FREE    EKG EKG Interpretation  Date/Time:  Saturday October 11 2017 17:26:15 EDT Ventricular Rate:  71 PR Interval:    QRS Duration: 98 QT Interval:  393 QTC Calculation: 428 R Axis:   4 Text Interpretation:  Normal sinus rhythm Low voltage, precordial leads no acute ST/T changes No old tracing to compare Confirmed by Sherwood Gambler [phone]) on 10/11/2017 5:32:14 PM   Radiology Dg Chest 2 View  Result Date: 10/11/2017 CLINICAL DATA:  42 y/o M; dyspnea on exertion with bilateral leg swelling. EXAM: CHEST - 2 VIEW COMPARISON:  02/13/2015 chest radiograph FINDINGS: Stable heart size and mediastinal contours are within normal limits. Low lung volumes accentuate pulmonary markings. No consolidation, effusion,  or pneumothorax. The visualized skeletal structures are unremarkable. IMPRESSION: No acute pulmonary process identified. Electronically Signed   By: Kristine Garbe M.D.   On: 10/11/2017 17:24    Procedures Procedures (including critical care time)  Medications Ordered in ED Medications  furosemide (LASIX) tablet 20 mg (20 mg Oral Given 10/11/17 2028)  potassium chloride SA (K-DUR,KLOR-CON) CR tablet 40 mEq (40 mEq Oral Given 10/11/17 2027)     Initial Impression / Assessment and Plan / ED Course  I have reviewed the triage vital signs and the nursing notes.  Pertinent labs & imaging results that were available during my care of the patient were reviewed by me and considered in my medical decision making (see chart for details).     42 year old male with a history of thyroid disease presenting with bilateral peripheral edema and dyspnea on exertion onset November 2018.  He was previously followed by Benita Gutter at the [facility], but has not been seen in some  time.  He reports that he has not taken his levothyroxine for hypothyroidism in over 4 years.  States that he did have a cardiac cath in 2013, but no stents were placed at that time.  EKG with normal sinus rhythm.  Chest x-ray is negative.  Troponin is negative.  Creatinine is 1.25, improved from previous.  He is noted to have an albumin of 3.1 and total protein of 5.3.  Transaminases and alk phos are normal.  Doubt underlying liver disease.  Patient also reports infrequent alcohol use.  BNP is normal. CBC is unremarkable.  The patient was discussed with Dr. Verner Chol, attending physician.  TSH 171.  The patient reports that he receives all of his medications through [facility]cat at the [facility] and will not be able to afford filling any prescription at a pharmacy.  He states that he has not been able to afford his levothyroxine previously aside from this.  The patient has been given a dose of Lasix and potassium chloride in the ED; however, he states that he plans to fill these also at the [facility]. Doubt ACS, PNA, cardiac tamponade, CKD, or hepatic disease.  It sounds as if he also may have some undiagnosed OSA, which I have recommended that he also have further worked up in the outpatient setting.  I have discussed with the patient that he should follow-up outpatient with [facility] and will require more of a work-up.  Recommended compression stockings.  Strict return precautions given.  At this time, the patient is hemodynamically stable and in no acute distress.  He is safe for discharge with outpatient follow-up at this time.  Final Clinical Impressions(s) / ED Diagnoses   Final diagnoses:  Bilateral lower extremity edema  Dyspnea on exertion    ED Discharge Orders        Ordered    furosemide (LASIX) 20 MG tablet  Daily     10/11/17 2045    potassium chloride SA (K-DUR,KLOR-CON) 20 MEQ tablet  2 times daily     10/11/17 2045       Miner Koral A, PA-C 10/11/17 2336    Sherwood Gambler,  MD 10/11/17 [phone]

## 2017-10-11 NOTE — ED Notes (Signed)
Pt walked from his room to the bathroom and back to his room.  The lowest his O2 reached was 97%.

## 2017-10-11 NOTE — ED Triage Notes (Signed)
Patient here from home with complaints of bilateral leg swelling and pain. Increased with walking. States that he no longer has hair on his legs.

## 2017-10-11 NOTE — ED Notes (Signed)
Patient transported to X-ray 

## 2017-12-14 ENCOUNTER — Encounter (HOSPITAL_COMMUNITY): Payer: Self-pay | Admitting: Emergency Medicine

## 2017-12-14 ENCOUNTER — Emergency Department (HOSPITAL_COMMUNITY): Payer: Self-pay

## 2017-12-14 ENCOUNTER — Other Ambulatory Visit: Payer: Self-pay

## 2017-12-14 ENCOUNTER — Emergency Department (HOSPITAL_COMMUNITY)
Admission: EM | Admit: 2017-12-14 | Discharge: 2017-12-14 | Disposition: A | Discharge status: Home / Self Care | Payer: Self-pay | Attending: Emergency Medicine | Admitting: Emergency Medicine

## 2017-12-14 DIAGNOSIS — F17210 Nicotine dependence, cigarettes, uncomplicated: Secondary | ICD-10-CM | POA: Insufficient documentation

## 2017-12-14 DIAGNOSIS — E039 Hypothyroidism, unspecified: Secondary | ICD-10-CM | POA: Insufficient documentation

## 2017-12-14 DIAGNOSIS — R110 Nausea: Secondary | ICD-10-CM | POA: Insufficient documentation

## 2017-12-14 DIAGNOSIS — H538 Other visual disturbances: Secondary | ICD-10-CM | POA: Insufficient documentation

## 2017-12-14 DIAGNOSIS — R42 Dizziness and giddiness: Secondary | ICD-10-CM | POA: Insufficient documentation

## 2017-12-14 DIAGNOSIS — I2510 Atherosclerotic heart disease of native coronary artery without angina pectoris: Secondary | ICD-10-CM | POA: Insufficient documentation

## 2017-12-14 DIAGNOSIS — Z79899 Other long term (current) drug therapy: Secondary | ICD-10-CM | POA: Insufficient documentation

## 2017-12-14 LAB — CBC
HEMATOCRIT: 48.5 % (ref 39.0–52.0)
HEMOGLOBIN: 15.9 g/dL (ref 13.0–17.0)
MCH: 32.9 pg (ref 26.0–34.0)
MCHC: 32.8 g/dL (ref 30.0–36.0)
MCV: 100.2 fL — ABNORMAL HIGH (ref 78.0–100.0)
Platelets: 256 10*3/uL (ref 150–400)
RBC: 4.84 MIL/uL (ref 4.22–5.81)
RDW: 14.5 % (ref 11.5–15.5)
WBC: 10 10*3/uL (ref 4.0–10.5)

## 2017-12-14 LAB — COMPREHENSIVE METABOLIC PANEL
ALK PHOS: 36 U/L — AB (ref 38–126)
ALT: 38 U/L (ref 0–44)
AST: 36 U/L (ref 15–41)
Albumin: 3.2 g/dL — ABNORMAL LOW (ref 3.5–5.0)
Anion gap: 8 (ref 5–15)
BUN: 6 mg/dL (ref 6–20)
CALCIUM: 8.6 mg/dL — AB (ref 8.9–10.3)
CO2: 25 mmol/L (ref 22–32)
Chloride: 107 mmol/L (ref 98–111)
Creatinine, Ser: 1.35 mg/dL — ABNORMAL HIGH (ref 0.61–1.24)
GFR calc Af Amer: >60 mL/min (ref >60)
GFR calc non Af Amer: >60 mL/min (ref >60)
GLUCOSE: 115 mg/dL — AB (ref 70–99)
Potassium: 4.1 mmol/L (ref 3.5–5.1)
Sodium: 140 mmol/L (ref 135–145)
Total Bilirubin: 0.7 mg/dL (ref 0.3–1.2)
Total Protein: 5.6 g/dL — ABNORMAL LOW (ref 6.5–8.1)

## 2017-12-14 LAB — DIFFERENTIAL
ABS IMMATURE GRANULOCYTES: 0.1 10*3/uL (ref 0.0–0.1)
BASOS ABS: 0.1 10*3/uL (ref 0.0–0.1)
BASOS PCT: 1 %
Eosinophils Absolute: 0.1 10*3/uL (ref 0.0–0.7)
Eosinophils Relative: 1 %
Immature Granulocytes: 1 %
LYMPHS PCT: 13 %
Lymphs Abs: 1.3 10*3/uL (ref 0.7–4.0)
Monocytes Absolute: 0.5 10*3/uL (ref 0.1–1.0)
Monocytes Relative: 5 %
Neutro Abs: 8 10*3/uL — ABNORMAL HIGH (ref 1.7–7.7)
Neutrophils Relative %: 79 %

## 2017-12-14 LAB — CBG MONITORING, ED: Glucose-Capillary: 100 mg/dL — ABNORMAL HIGH (ref 70–99)

## 2017-12-14 LAB — I-STAT TROPONIN, ED: Troponin i, poc: 0 ng/mL (ref 0.00–0.08)

## 2017-12-14 LAB — I-STAT CHEM 8, ED
BUN: 6 mg/dL (ref 6–20)
CHLORIDE: 105 mmol/L (ref 98–111)
CREATININE: 1.4 mg/dL — AB (ref 0.61–1.24)
Calcium, Ion: 1.08 mmol/L — ABNORMAL LOW (ref 1.15–1.40)
Glucose, Bld: 108 mg/dL — ABNORMAL HIGH (ref 70–99)
HCT: 47 % (ref 39.0–52.0)
Hemoglobin: 16 g/dL (ref 13.0–17.0)
POTASSIUM: 3.9 mmol/L (ref 3.5–5.1)
Sodium: 139 mmol/L (ref 135–145)
TCO2: 24 mmol/L (ref 22–32)

## 2017-12-14 LAB — TSH: TSH: 168.398 u[IU]/mL — AB (ref 0.350–4.500)

## 2017-12-14 LAB — T4, FREE: Free T4: <0.25 ng/dL — ABNORMAL LOW (ref 0.82–1.77)

## 2017-12-14 MED ORDER — MECLIZINE HCL 25 MG PO TABS: 25.0000 mg | ORAL_TABLET | Freq: Three times a day (TID) | ORAL | 0 refills | Status: DC | PRN

## 2017-12-14 MED ORDER — MECLIZINE HCL 25 MG PO TABS
25.0000 mg | ORAL_TABLET | Freq: Once | ORAL | Status: AC
  Administered 2017-12-14: 25 mg via ORAL
  Filled 2017-12-14: qty 1

## 2017-12-14 MED ORDER — LACTATED RINGERS IV BOLUS
1000.0000 mL | Freq: Once | INTRAVENOUS | Status: AC
  Administered 2017-12-14: 1000 mL via INTRAVENOUS

## 2017-12-14 MED ORDER — LEVOTHYROXINE SODIUM 50 MCG PO TABS
50.0000 ug | ORAL_TABLET | Freq: Every day | ORAL | Status: DC
  Administered 2017-12-14: 50 ug via ORAL
  Filled 2017-12-14: qty 1

## 2017-12-14 MED ORDER — LEVOTHYROXINE SODIUM 25 MCG PO TABS: 25.0000 ug | ORAL_TABLET | Freq: Every day | ORAL | Status: DC

## 2017-12-14 MED ORDER — LEVOTHYROXINE SODIUM 50 MCG PO TABS: 50.0000 ug | ORAL_TABLET | Freq: Every day | ORAL | 0 refills | Status: DC

## 2017-12-14 NOTE — ED Triage Notes (Addendum)
Pt arrives via [facility] for c/o dizziness, blurred vision and nausea that began at 0630 this am upon waking, pt also reports cough for the past few days. Pt a/ox4, speech clear, no neuro deficits. Pt given 4mg  zofran pta.

## 2017-12-14 NOTE — ED Notes (Signed)
Main lab call, T4 lab ok to be added on to TSH already collected

## 2017-12-14 NOTE — ED Notes (Signed)
Patient transported to CT 

## 2017-12-14 NOTE — ED Provider Notes (Signed)
[name] EMERGENCY DEPARTMENT Provider Note   CSN: [id number] Arrival date & time: 12/14/17  1149     History   Chief Complaint Chief Complaint  Patient presents with  . Dizziness  . Nausea    HPI Randy Webster is a 42 y.o. male.  The history is provided by the patient.  Dizziness  Quality:  Room spinning Severity:  Mild Onset quality:  Gradual Timing:  Constant Progression:  Unchanged Chronicity:  New Context: head movement   Relieved by:  Nothing Worsened by:  Nothing Associated symptoms: no blood in stool, no chest pain, no headaches, no palpitations, no shortness of breath, no tinnitus, no vision changes, no vomiting and no weakness   Risk factors: no hx of stroke, no hx of vertigo and no multiple medications     Past Medical History:  Diagnosis Date  . Thyroid disease     There are no active problems to display for this patient.   History reviewed. No pertinent surgical history.      Home Medications    Prior to Admission medications   Medication Sig Start Date End Date Taking? Authorizing Provider  furosemide (LASIX) 20 MG tablet Take 1 tablet (20 mg total) by mouth daily. 10/11/17  Yes McDonald, Mia A, PA-C  ibuprofen (ADVIL,MOTRIN) 200 MG tablet Take 800 mg by mouth every 4 (four) hours as needed for mild pain.   Yes [provider]  potassium chloride SA (K-DUR,KLOR-CON) 20 MEQ tablet Take 1 tablet (20 mEq total) by mouth 2 (two) times daily for 7 days. 10/11/17 12/14/17 Yes McDonald, Mia A, PA-C  levothyroxine (SYNTHROID, LEVOTHROID) 50 MCG tablet Take 1 tablet (50 mcg total) by mouth daily before breakfast. 12/14/17 01/13/18  Hansini Clodfelter, DO  meclizine (ANTIVERT) 25 MG tablet Take 1 tablet (25 mg total) by mouth 3 (three) times daily as needed for up to 20 doses for dizziness. 12/14/17   Virgina Norfolk, DO    Family History No family history on file.  Social History Social History   Tobacco Use  . Smoking status:  Current Every Day Smoker    Packs/day: 0.50    Types: Cigarettes  . Smokeless tobacco: Never Used  Substance Use Topics  . Alcohol use: No  . Drug use: No     Allergies   Patient has no known allergies.   Review of Systems Review of Systems  Constitutional: Negative for chills and fever.  HENT: Negative for ear pain, sore throat and tinnitus.   Eyes: Negative for pain and visual disturbance.  Respiratory: Negative for cough and shortness of breath.   Cardiovascular: Negative for chest pain and palpitations.  Gastrointestinal: Negative for abdominal pain, blood in stool and vomiting.  Genitourinary: Negative for dysuria and hematuria.  Musculoskeletal: Positive for gait problem. Negative for arthralgias and back pain.  Skin: Negative for color change and rash.  Neurological: Positive for dizziness. Negative for tremors, seizures, syncope, facial asymmetry, speech difficulty, weakness, light-headedness, numbness and headaches.  All other systems reviewed and are negative.    Physical Exam Updated Vital Signs  ED Triage Vitals  Enc Vitals Group     BP 12/14/17 1153 102/79     Pulse Rate 12/14/17 1153 82     Resp 12/14/17 1153 12     Temp 12/14/17 1153 99 F (37.2 C)     Temp Source 12/14/17 1153 Oral     SpO2 12/14/17 1153 98 %     Weight 12/14/17 1153 195 lb (  88.5 kg)     Height 12/14/17 1153 5\' 11"  (1.803 m)     Head Circumference --      Peak Flow --      Pain Score 12/14/17 1154 0     Pain Loc --      Pain Edu? --      Excl. in GC? --     Physical Exam  Constitutional: He is oriented to person, place, and time. He appears well-developed and well-nourished.  HENT:  Head: Normocephalic and atraumatic.  Right Ear: External ear normal.  Left Ear: External ear normal.  Eyes: Pupils are equal, round, and reactive to light. Conjunctivae and EOM are normal.  Neck: Normal range of motion. Neck supple.  Cardiovascular: Normal rate, regular rhythm, normal heart  sounds and intact distal pulses.  No murmur heard. Pulmonary/Chest: Effort normal and breath sounds normal. No respiratory distress.  Abdominal: Soft. Bowel sounds are normal. He exhibits no distension. There is no tenderness.  Musculoskeletal: Normal range of motion. He exhibits no edema.  Neurological: He is alert and oriented to person, place, and time. No cranial nerve deficit or sensory deficit. He exhibits normal muscle tone. Coordination normal.  Difficulty with walking due to dizziness, 5+/5 strength throughout, normal sensation, normal visual acuity, normal finger-to-nose finger, normal heel-to-shin  Skin: Skin is warm and dry.  Psychiatric: He has a normal mood and affect.  Nursing note and vitals reviewed.    ED Treatments / Results  Labs (all labs ordered are listed, but only abnormal results are displayed) Labs Reviewed  CBC - Abnormal; Notable for the following components:      Result Value   MCV 100.2 (*)    All other components within normal limits  DIFFERENTIAL - Abnormal; Notable for the following components:   Neutro Abs 8.0 (*)    All other components within normal limits  COMPREHENSIVE METABOLIC PANEL - Abnormal; Notable for the following components:   Glucose, Bld 115 (*)    Creatinine, Ser 1.35 (*)    Calcium 8.6 (*)    Total Protein 5.6 (*)    Albumin 3.2 (*)    Alkaline Phosphatase 36 (*)    All other components within normal limits  TSH - Abnormal; Notable for the following components:   TSH 168.398 (*)    All other components within normal limits  T4, FREE - Abnormal; Notable for the following components:   Free T4 <0.25 (*)    All other components within normal limits  CBG MONITORING, ED - Abnormal; Notable for the following components:   Glucose-Capillary 100 (*)    All other components within normal limits  I-STAT CHEM 8, ED - Abnormal; Notable for the following components:   Creatinine, Ser 1.40 (*)    Glucose, Bld 108 (*)    Calcium, Ion  1.08 (*)    All other components within normal limits  I-STAT TROPONIN, ED    EKG EKG Interpretation  Date/Time:  Sunday December 14 2017 12:06:35 EDT Ventricular Rate:  77 PR Interval:  176 QRS Duration: 86 QT Interval:  346 QTC Calculation: 391 R Axis:   31 Text Interpretation:  Normal sinus rhythm Nonspecific T wave abnormality Confirmed by Virgina Norfolk [phone]) on 12/14/2017 1:16:00 PM   Radiology Ct Head Wo Contrast  Result Date: 12/14/2017 CLINICAL DATA:  Dizziness, blurred vision and nausea beginning 0630 hours. EXAM: CT HEAD WITHOUT CONTRAST TECHNIQUE: Contiguous axial images were obtained from the base of the skull through the vertex without  intravenous contrast. COMPARISON:  None. FINDINGS: Brain: The brain does not show atrophy. No acute finding is seen affecting the brainstem or cerebellum. There are mild chronic small-vessel changes of the hemispheric white matter. No cortical or large vessel territory infarction. No mass lesion, hemorrhage, hydrocephalus or extra-axial collection. Vascular: Markedly premature atherosclerotic calcification of the major vessels at the base of the brain, particularly prominent in the posterior circulation vessels. Skull: Negative Sinuses/Orbits: Clear/normal Other: None IMPRESSION: No acute brain finding by CT. Mild chronic small-vessel change of the cerebral hemispheric white matter. 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 Electronically Signed   By: Paulina FusiMark  Shogry M.D.   On: 12/14/2017 13:53   Mr Brain Wo Contrast (neuro Protocol)  Result Date: 12/14/2017 CLINICAL DATA:  Dizziness and blurred vision with nausea beginning today. EXAM: MRI HEAD WITHOUT CONTRAST MRA HEAD WITHOUT CONTRAST TECHNIQUE: Multiplanar, multiecho pulse sequences of the brain and surrounding structures were obtained without intravenous contrast. Angiographic images of the head were  obtained using MRA technique without contrast. COMPARISON:  CT earlier same day. FINDINGS: MRI HEAD FINDINGS Brain: Diffusion imaging does not show any acute or subacute infarction. Mild chronic small-vessel ischemic change affects the pons. 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 No focal cerebellar insult. Cerebral hemispheres show mild chronic small-vessel ischemic changes of the white matter. No cortical or large vessel territory infarction. No mass lesion, hemorrhage, hydrocephalus or extra-axial collection. Vascular: Major vessels at the base of the brain show flow. Skull and upper cervical spine: Negative Sinuses/Orbits: Clear/normal Other: None MRA HEAD FINDINGS Both internal carotid arteries are widely patent through the skull base and siphon regions. The anterior and middle cerebral vessels are patent without correctable proximal stenosis. More distal branch vessels show mild atherosclerotic irregularity. Both vertebral arteries are patent with the left being dominant. Dominant left PICA is visualized. The basilar artery shows ectasia and atherosclerotic irregularity but no stenosis. Anterior inferior cerebellar arteries, superior cerebellar arteries and posterior cerebral arteries are patent, with atherosclerotic irregularity of the more distal PCA branches. IMPRESSION: No acute or subacute infarction. Mild chronic small-vessel ischemic change of the pons and cerebral hemispheric white matter. Premature atherosclerotic change of the intracranial vessels, but without large or medium vessel stenosis or occlusion. 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 The basilar artery indents the right side of the pons. The patient does show some atherosclerotic irregularity of the more distal intracranial branch vessels, particularly evident in the PCA branches. Electronically  Signed   By: Paulina FusiMark  Shogry M.D.   On: 12/14/2017 16:35   Dg Chest Portable 1 View  Result Date: 12/14/2017 CLINICAL DATA:  Dizziness, blurred vision and nausea since this morning. EXAM: PORTABLE CHEST 1 VIEW COMPARISON:  10/11/2017 FINDINGS: The cardiac silhouette, mediastinal and hilar contours are normal and stable. The lungs are clear. No pleural effusion. The bony thorax is intact. IMPRESSION: No acute cardiopulmonary findings. Electronically Signed   By: Rudie MeyerP.  Gallerani M.D.   On: 12/14/2017 13:45   Mr Maxine GlennMra Head (cerebral Arteries)  Result Date: 12/14/2017 CLINICAL DATA:  Dizziness and blurred vision with nausea beginning today. EXAM: MRI HEAD WITHOUT CONTRAST MRA HEAD WITHOUT CONTRAST TECHNIQUE: Multiplanar, multiecho pulse sequences of the brain and surrounding structures were obtained without intravenous contrast. Angiographic images of the head were obtained using MRA technique without contrast. COMPARISON:  CT earlier same day. FINDINGS: MRI HEAD FINDINGS Brain: Diffusion imaging does not show any acute or subacute infarction. Mild chronic small-vessel ischemic change affects the pons. 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 No focal cerebellar insult. Cerebral hemispheres show mild chronic small-vessel ischemic changes of the white matter. No cortical or large vessel territory infarction. No mass lesion, hemorrhage, hydrocephalus or extra-axial collection. Vascular: Major vessels at the base of the brain show flow. Skull and upper cervical spine: Negative Sinuses/Orbits: Clear/normal Other: None MRA HEAD FINDINGS Both internal carotid arteries are widely patent through the skull base and siphon regions. The anterior and middle cerebral vessels are patent without correctable proximal stenosis. More distal branch vessels show mild atherosclerotic irregularity. Both vertebral arteries are patent with the left being dominant. Dominant left PICA is  visualized. The basilar artery shows ectasia and atherosclerotic irregularity but no stenosis. Anterior inferior cerebellar arteries, superior cerebellar arteries and posterior cerebral arteries are patent, with atherosclerotic irregularity of the more distal PCA branches. IMPRESSION: No acute or subacute infarction. Mild chronic small-vessel ischemic change of the pons and cerebral hemispheric white matter. Premature atherosclerotic change of the intracranial vessels, but without large or medium vessel stenosis or occlusion. 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 The basilar artery indents the right side of the pons. The patient does show some atherosclerotic irregularity of the more distal intracranial branch vessels, particularly evident in the PCA branches. Electronically Signed   By: Paulina Fusi M.D.   On: 12/14/2017 16:35    Procedures Procedures (including critical care time)  Medications Ordered in ED Medications  levothyroxine (SYNTHROID, LEVOTHROID) tablet 50 mcg (50 mcg Oral Given 12/14/17 1719)  meclizine (ANTIVERT) tablet 25 mg (25 mg Oral Given 12/14/17 1344)  lactated ringers bolus 1,000 mL (0 mLs Intravenous Stopped 12/14/17 1526)     Initial Impression / Assessment and Plan / ED Course  I have reviewed the triage vital signs and the nursing notes.  Pertinent labs & imaging results that were available during my care of the patient were reviewed by me and considered in my medical decision making (see chart for details).     Randy Webster is a 42 year old male history of hypothyroidism who presents to the ED with dizziness, nausea.  Patient with normal vitals.  No fever.  Patient states that he woke up this morning and has had continuous dizziness, intermittent nausea.  Patient states some possible blurred vision as well.  Patient denies any chest pain, shortness of breath.  Denies any fatigue.   Denies any drug or alcohol use.  He used to be on Synthroid medication but is no longer on it.  Does not have a primary care doctor.  Neurologically patient is intact.  He does have dizziness when he ambulates and therefore difficulty walking.  However he does appear to have a possible positive Dix-Hallpike.  He becomes extremely symptomatic when he turns his head but he continues to be symptomatic even at rest.  No obvious nystagmus.  20/20 vision bilaterally.  No visual field deficits on exam.  Normal finger-to-nose finger and heel-to-shin.  Patient with EKG that shows sinus rhythm.  Troponin within normal limits.  Doubt cardiac process.  No significant electrolyte abnormality, creatinine at baseline.  No significant leukocytosis or anemia.  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  Given that  patient has some concerns for central vertiginous process MRI and MRA of the brain was ordered to rule out posterior stroke.  Patient was given meclizine for possible peripheral vertigo.  Thyroid studies were also obtained.  Patient with no acute stroke found on MRI/MRA of the brain.  Thyroid studies show patient likely with ongoing hypothyroidism.  Will start patient on 50 mcg of Synthroid, he was previously on 125 mcg in the past.  Patient does have some atherosclerotic disease found on MRA and is not on any antilipid medication.  He does not have a primary doctor at this time.  Had a prolonged discussion with the patient about need to follow-up with her primary care doctor and was given number for the wellness center.  Will start the patient on Synthroid and given the first dose in the ED.  No concern for myxedema e coma at this time.  Patient states that meclizine has helped his dizziness.  He is able to ambulate now without any issues as well.  Suspect patient likely with some peripheral vertigo and will prescribe meclizine.  Told to return to ED if symptoms  worsen.  Discharged from ED in good condition.  This chart was dictated using voice recognition software.  Despite best efforts to proofread,  errors can occur which can change the documentation meaning.   Final Clinical Impressions(s) / ED Diagnoses   Final diagnoses:  Vertigo  Hypothyroidism, unspecified type    ED Discharge Orders         Ordered    meclizine (ANTIVERT) 25 MG tablet  3 times daily PRN     12/14/17 1722    levothyroxine (SYNTHROID, LEVOTHROID) 50 MCG tablet  Daily before breakfast     12/14/17 1722           Florabel Faulks, DO 12/14/17 1723

## 2017-12-14 NOTE — ED Notes (Signed)
Patient verbalizes understanding of discharge instructions. Opportunity for questioning and answers were provided. Armband removed by staff, pt discharged from ED.  

## 2017-12-14 NOTE — ED Notes (Signed)
Patient transported to MRI 

## 2017-12-15 MED FILL — LEVOTHYROXINE 50 MCG TABLET: 50 | 30 days supply | Qty: 30 | Fill #0

## 2017-12-15 MED FILL — MECLIZINE 25 MG TABLET: 25 | 7 days supply | Qty: 20 | Fill #0

## 2017-12-16 ENCOUNTER — Encounter: Payer: Self-pay | Admitting: Pediatric Intensive Care

## 2017-12-25 ENCOUNTER — Inpatient Hospital Stay: Payer: Self-pay

## 2017-12-25 NOTE — Progress Notes (Deleted)
Patient ID: Randy Webster, male   DOB: 08-06-1975, 42 y.o.   MRN: [id number]  After being seen at the ED 12/14/2017 for dizziness and nausea. EKG and cardiac enzymes unremarkable.  MRI/MRA essentially normal/negative for stroke.    From A/P: 42 year old male history of hypothyroidism who presents to the ED with dizziness, nausea.  Patient with normal vitals.  No fever.  Patient states that he woke up this morning and has had continuous dizziness, intermittent nausea.  Patient states some possible blurred vision as well.  Patient denies any chest pain, shortness of breath.  Denies any fatigue.  Denies any drug or alcohol use.  He used to be on Synthroid medication but is no longer on it.  Does not have a primary care doctor.  Neurologically patient is intact.  He does have dizziness when he ambulates and therefore difficulty walking.  However he does appear to have a possible positive Dix-Hallpike.  He becomes extremely symptomatic when he turns his head but he continues to be symptomatic even at rest.  No obvious nystagmus.  20/20 vision bilaterally.  No visual field deficits on exam.  Normal finger-to-nose finger and heel-to-shin.  Patient with EKG that shows sinus rhythm.  Troponin within normal limits.  Doubt cardiac process.  No significant electrolyte abnormality, creatinine at baseline.  No significant leukocytosis or anemia.  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  Given that patient has some concerns for central vertiginous process MRI and MRA of the brain was ordered to rule out posterior stroke.  Patient was given meclizine for possible peripheral vertigo.  Thyroid studies were also obtained.  Patient with no acute stroke found on MRI/MRA of the brain.  Thyroid studies show patient likely with ongoing hypothyroidism.  Will start patient on 50 mcg of Synthroid, he was previously on 125 mcg in the past.  Patient does have some atherosclerotic  disease found on MRA and is not on any antilipid medication.  He does not have a primary doctor at this time.  Had a prolonged discussion with the patient about need to follow-up with her primary care doctor and was given number for the wellness center.  Will start the patient on Synthroid and given the first dose in the ED.  No concern for myxedema e coma at this time.  Patient states that meclizine has helped his dizziness.  He is able to ambulate now without any issues as well.  Suspect patient likely with some peripheral vertigo and will prescribe meclizine.  Told to return to ED if symptoms worsen.  Discharged from ED in good condition.

## 2017-12-29 ENCOUNTER — Encounter: Payer: Self-pay | Admitting: Pediatric Intensive Care

## 2018-01-05 NOTE — Congregational Nurse Program (Signed)
Was seen in ED- has history of hypothyroidism. Needs synthroid filled as well as meclizine. States he has ED follow up on 9/26 and will need bus passes. Client is not guest at [facility] and is living in the community. Will meet CN at shelter this afternoon to receive medication.

## 2018-01-06 NOTE — Congregational Nurse Program (Signed)
States feeling better since back on synthroid. States that he missed his appointment on 9/26. Needs assistance re-scheduling. Will take first available.

## 2018-04-15 ENCOUNTER — Emergency Department (HOSPITAL_COMMUNITY)
Admission: EM | Admit: 2018-04-15 | Discharge: 2018-04-15 | Disposition: A | Discharge status: Home / Self Care | Payer: Self-pay | Attending: Emergency Medicine | Admitting: Emergency Medicine

## 2018-04-15 ENCOUNTER — Other Ambulatory Visit: Payer: Self-pay

## 2018-04-15 ENCOUNTER — Encounter (HOSPITAL_COMMUNITY): Payer: Self-pay | Admitting: *Deleted

## 2018-04-15 DIAGNOSIS — Z79899 Other long term (current) drug therapy: Secondary | ICD-10-CM | POA: Insufficient documentation

## 2018-04-15 DIAGNOSIS — F17210 Nicotine dependence, cigarettes, uncomplicated: Secondary | ICD-10-CM | POA: Insufficient documentation

## 2018-04-15 DIAGNOSIS — G43009 Migraine without aura, not intractable, without status migrainosus: Secondary | ICD-10-CM | POA: Insufficient documentation

## 2018-04-15 MED ORDER — METOCLOPRAMIDE HCL 5 MG/ML IJ SOLN
10.0000 mg | Freq: Once | INTRAMUSCULAR | Status: AC
  Administered 2018-04-15: 10 mg via INTRAVENOUS
  Filled 2018-04-15: qty 2

## 2018-04-15 MED ORDER — DEXAMETHASONE SODIUM PHOSPHATE 10 MG/ML IJ SOLN
10.0000 mg | Freq: Once | INTRAMUSCULAR | Status: AC
  Administered 2018-04-15: 10 mg via INTRAVENOUS
  Filled 2018-04-15: qty 1

## 2018-04-15 MED ORDER — KETOROLAC TROMETHAMINE 15 MG/ML IJ SOLN
15.0000 mg | Freq: Once | INTRAMUSCULAR | Status: AC
  Administered 2018-04-15: 15 mg via INTRAVENOUS
  Filled 2018-04-15: qty 1

## 2018-04-15 MED ORDER — DIPHENHYDRAMINE HCL 50 MG/ML IJ SOLN
12.5000 mg | Freq: Once | INTRAMUSCULAR | Status: AC
  Administered 2018-04-15: 12.5 mg via INTRAVENOUS
  Filled 2018-04-15: qty 1

## 2018-04-15 MED ORDER — SODIUM CHLORIDE 0.9 % IV BOLUS
1000.0000 mL | Freq: Once | INTRAVENOUS | Status: AC
  Administered 2018-04-15: 1000 mL via INTRAVENOUS

## 2018-04-15 NOTE — ED Triage Notes (Signed)
C/o headache onset 2 days ago

## 2018-04-15 NOTE — ED Provider Notes (Signed)
[name]   CSN: [id number] Arrival date & time: 04/15/18  [id number]   History   Chief Complaint Chief Complaint  Patient presents with  . Headache    HPI Randy Webster is a 43 y.o. male with past medical history significant for headaches who presents for evaluation of headache.  Patient states he has had a headache x2 days.  States this started gradually in nature and has been worsening.  Denies sudden onset thunderclap headache.  Patient states this feels similar to his previous headaches.  Has been taking Tylenol without relief of symptoms.  Last Tylenol 24 hours ago.  Patient states he ran out of money to afford additional Tylenol and took his last 2 yesterday.  Patient admits to photophobia without phonophobia.  Denies recent illnesses.  Denies fever, chills, nausea, vomiting, vision changes, eye pain, neck pain, neck stiffness, facial asymmetry, slurred speech, unilateral weakness, numbness or tingling in his extremities, difficulty with word finding, chest pain, shortness of breath, abdominal pain, diarrhea dysuria.  Patient rates his head pain a 10/10.  Pain does not radiate.  Describes his pain as a throbbing sensation.  Pain is located over his right forehead and right temporal region.  Denies additional aggravating or alleviating factors.  History provided by patient and significant other.  No interpreter was used.  HPI  Past Medical History:  Diagnosis Date  . Thyroid disease     There are no active problems to display for this patient.   History reviewed. No pertinent surgical history.      Home Medications    Prior to Admission medications   Medication Sig Start Date End Date Taking? Authorizing Provider  furosemide (LASIX) 20 MG tablet Take 1 tablet (20 mg total) by mouth daily. 10/11/17   McDonald, Mia A, PA-C  ibuprofen (ADVIL,MOTRIN) 200 MG tablet Take 800 mg by mouth every 4 (four) hours as needed for mild pain.     [provider]  levothyroxine (SYNTHROID, LEVOTHROID) 50 MCG tablet Take 1 tablet (50 mcg total) by mouth daily before breakfast. 12/14/17 01/13/18  Curatolo, Adam, DO  meclizine (ANTIVERT) 25 MG tablet Take 1 tablet (25 mg total) by mouth 3 (three) times daily as needed for up to 20 doses for dizziness. 12/14/17   Curatolo, Adam, DO  potassium chloride SA (K-DUR,KLOR-CON) 20 MEQ tablet Take 1 tablet (20 mEq total) by mouth 2 (two) times daily for 7 days. 10/11/17 12/14/17  McDonald, Pedro EarlsMia A, PA-C    Family History No family history on file.  Social History Social History   Tobacco Use  . Smoking status: Current Every Day Smoker    Packs/day: 0.50    Types: Cigarettes  . Smokeless tobacco: Never Used  Substance Use Topics  . Alcohol use: No  . Drug use: No     Allergies   Patient has no known allergies.   Review of Systems Review of Systems  Constitutional: Negative.   HENT: Negative.   Eyes: Negative.   Respiratory: Negative.   Cardiovascular: Negative.   Gastrointestinal: Negative.   Genitourinary: Negative.   Musculoskeletal: Negative.   Neurological: Positive for headaches. Negative for dizziness, tremors, seizures, syncope, facial asymmetry, speech difficulty, weakness, light-headedness and numbness.  All other systems reviewed and are negative.    Physical Exam Updated Vital Signs BP 134/82 (BP Location: Right Arm)   Pulse 67   Temp 98.4 F (36.9 C) (Oral)   Resp 18   Ht 5\' 11"  (1.803  m)   Wt 88.9 kg   SpO2 95%   BMI 27.34 kg/m   Physical Exam  Physical Exam  Constitutional: Pt is oriented to person, place, and time. Pt appears well-developed and well-nourished. No distress.  HENT:  Head: Normocephalic and atraumatic.  Mouth/Throat: Oropharynx is clear and moist.  Eyes: Conjunctivae and EOM are normal. Pupils are equal, round, and reactive to light. No scleral icterus.  No horizontal, vertical or rotational nystagmus  Neck: Normal range of  motion. Neck supple.  Full active and passive ROM without pain No midline or paraspinal tenderness No nuchal rigidity or meningeal signs  Cardiovascular: Normal rate, regular rhythm and intact distal pulses.   Pulmonary/Chest: Effort normal and breath sounds normal. No respiratory distress. Pt has no wheezes. No rales.  Abdominal: Soft. Bowel sounds are normal. There is no tenderness. There is no rebound and no guarding.  Musculoskeletal: Normal range of motion.  Lymphadenopathy:    No cervical adenopathy.  Neurological: Pt. is alert and oriented to person, place, and time. He has normal reflexes. No cranial nerve deficit.  Exhibits normal muscle tone. Coordination normal.  Mental Status:  Alert, oriented, thought content appropriate. Speech fluent without evidence of aphasia. Able to follow 2 step commands without difficulty.  Cranial Nerves:  II:  Peripheral visual fields grossly normal, pupils equal, round, reactive to light III,IV, VI: ptosis not present, extra-ocular motions intact bilaterally  V,VII: smile symmetric, facial light touch sensation equal VIII: hearing grossly normal bilaterally  IX,X: midline uvula rise  XI: bilateral shoulder shrug equal and strong XII: midline tongue extension  Motor:  5/5 in upper and lower extremities bilaterally including strong and equal grip strength and dorsiflexion/plantar flexion Sensory: Pinprick and light touch normal in all extremities.  Deep Tendon Reflexes: 2+ and symmetric  Cerebellar: normal finger-to-nose with bilateral upper extremities Gait: normal gait and balance CV: distal pulses palpable throughout   Skin: Skin is warm and dry. No rash noted. Pt is not diaphoretic.  Psychiatric: Pt has a normal mood and affect. Behavior is normal. Judgment and thought content normal.  Nursing note and vitals reviewed. ED Treatments / Results  Labs (all labs ordered are listed, but only abnormal results are displayed) Labs Reviewed - No  data to display  EKG None  Radiology No results found.  Procedures Procedures (including critical care time)  Medications Ordered in ED Medications  metoCLOPramide (REGLAN) injection 10 mg (10 mg Intravenous Given 04/15/18 0808)  diphenhydrAMINE (BENADRYL) injection 12.5 mg (12.5 mg Intravenous Given 04/15/18 0808)  sodium chloride 0.9 % bolus 1,000 mL (0 mLs Intravenous Stopped 04/15/18 1106)  ketorolac (TORADOL) 15 MG/ML injection 15 mg (15 mg Intravenous Given 04/15/18 1018)  dexamethasone (DECADRON) injection 10 mg (10 mg Intravenous Given 04/15/18 1019)     Initial Impression / Assessment and Plan / ED Course  I have reviewed the triage vital signs and the nursing notes.  Pertinent labs & imaging results that were available during my care of the patient were reviewed by me and considered in my medical decision making (see chart for details).  43 year old male who appears otherwise well presents for evaluation of headache.  Patient with history of previous headaches.  States this feels similar.  Denies sudden onset thunderclap headache.  No recent URI symptoms, no fever, eye pain, blurred vision, neck pain, neck stiffness.  Patient is out of money and is unable to afford Tylenol at home.  Last took Tylenol 24 hours ago without relief of symptoms. Non  focal neuro exam without neuro deficits. Afebrile, on septic, non ill appearing. Will provide migraine cocktail and reevaluate.  Pain resolved with migraine cocktail. Presentation is like pts typical HA and non concerning for SAH, ICH, Meningitis, or [facility]. Pt is afebrile with no focal neuro deficits, nuchal rigidity, or change in vision. Pt is to follow up with PCP to discuss prophylactic medication. Patient is hemodynamically stable and appropriate for DC home at this time.  Discussed strict return precautions.  Patient and nificant other voiced understanding and are agreeable for follow-up.     Final Clinical Impressions(s) /  ED Diagnoses   Final diagnoses:  Migraine without aura and without status migrainosus, not intractable    ED Discharge Orders    None       ,  A, PA-C 04/15/18 1114    Arby BarrettePfeiffer, Marcy, MD 04/16/18 [phone]

## 2018-04-15 NOTE — Discharge Instructions (Addendum)
Evaluated today for headache.  Symptoms resolved after medication department.  Follow-up with PCP for reevaluation if you continue to have headaches.  Return to the ED for any worsening symptoms such as Get help right away if: Your migraine gets very bad. You have a fever. You have a stiff neck. You have trouble seeing. Your muscles feel weak or like you cannot control them. You start to lose your balance a lot. You start to have trouble walking. You pass out (faint).

## 2018-04-16 ENCOUNTER — Other Ambulatory Visit: Payer: Self-pay

## 2018-04-16 ENCOUNTER — Emergency Department (HOSPITAL_COMMUNITY)
Admission: EM | Admit: 2018-04-16 | Discharge: 2018-04-16 | Disposition: A | Discharge status: Home / Self Care | Payer: Self-pay | Attending: Emergency Medicine | Admitting: Emergency Medicine

## 2018-04-16 ENCOUNTER — Encounter (HOSPITAL_COMMUNITY): Payer: Self-pay | Admitting: *Deleted

## 2018-04-16 DIAGNOSIS — W502XXA Accidental twist by another person, initial encounter: Secondary | ICD-10-CM | POA: Insufficient documentation

## 2018-04-16 DIAGNOSIS — Y999 Unspecified external cause status: Secondary | ICD-10-CM | POA: Insufficient documentation

## 2018-04-16 DIAGNOSIS — Y929 Unspecified place or not applicable: Secondary | ICD-10-CM | POA: Insufficient documentation

## 2018-04-16 DIAGNOSIS — Y93E6 Activity, residential relocation: Secondary | ICD-10-CM | POA: Insufficient documentation

## 2018-04-16 DIAGNOSIS — F17210 Nicotine dependence, cigarettes, uncomplicated: Secondary | ICD-10-CM | POA: Insufficient documentation

## 2018-04-16 DIAGNOSIS — S86812A Strain of other muscle(s) and tendon(s) at lower leg level, left leg, initial encounter: Secondary | ICD-10-CM | POA: Insufficient documentation

## 2018-04-16 DIAGNOSIS — Z79899 Other long term (current) drug therapy: Secondary | ICD-10-CM | POA: Insufficient documentation

## 2018-04-16 MED ORDER — KETOROLAC TROMETHAMINE 60 MG/2ML IM SOLN
60.0000 mg | Freq: Once | INTRAMUSCULAR | Status: AC
  Administered 2018-04-16: 60 mg via INTRAMUSCULAR
  Filled 2018-04-16: qty 2

## 2018-04-16 NOTE — ED Triage Notes (Signed)
Pt was helping a friend move things yesterday and twisted his leg.  Pt reports left leg pain.  Pt is able to ambulate in triage with a limp. Pt a/o x 4.

## 2018-04-16 NOTE — ED Provider Notes (Signed)
[facility]-EMERGENCY DEPT Provider Note   CSN: [id number] Arrival date & time: 04/16/18  0134     History   Chief Complaint No chief complaint on file.   HPI Randy Webster is a 43 y.o. male.   Leg Pain  Location:  Leg Time since incident:  5 hours Injury: no   Leg location:  L leg and L lower leg Pain details:    Quality:  Sharp, cramping, throbbing and shooting   Radiates to:  Does not radiate   Severity:  Moderate   Timing:  Constant Chronicity:  New Dislocation: no   Prior injury to area:  No Relieved by:  NSAIDs Worsened by:  Bearing weight Associated symptoms: no decreased ROM, no fever, no neck pain, no numbness, no swelling and no tingling     Past Medical History:  Diagnosis Date  . Thyroid disease     There are no active problems to display for this patient.   History reviewed. No pertinent surgical history.      Home Medications    Prior to Admission medications   Medication Sig Start Date End Date Taking? Authorizing Provider  furosemide (LASIX) 20 MG tablet Take 1 tablet (20 mg total) by mouth daily. 10/11/17   McDonald, Mia A, PA-C  ibuprofen (ADVIL,MOTRIN) 200 MG tablet Take 800 mg by mouth every 4 (four) hours as needed for mild pain.    [provider]  levothyroxine (SYNTHROID, LEVOTHROID) 50 MCG tablet Take 1 tablet (50 mcg total) by mouth daily before breakfast. 12/14/17 01/13/18  Curatolo, Adam, DO  meclizine (ANTIVERT) 25 MG tablet Take 1 tablet (25 mg total) by mouth 3 (three) times daily as needed for up to 20 doses for dizziness. 12/14/17   Curatolo, Adam, DO  potassium chloride SA (K-DUR,KLOR-CON) 20 MEQ tablet Take 1 tablet (20 mEq total) by mouth 2 (two) times daily for 7 days. 10/11/17 12/14/17  McDonald, Pedro EarlsMia A, PA-C    Family History No family history on file.  Social History Social History   Tobacco Use  . Smoking status: Current Every Day Smoker    Packs/day: 0.50    Types: Cigarettes  .  Smokeless tobacco: Never Used  Substance Use Topics  . Alcohol use: No  . Drug use: No     Allergies   Patient has no known allergies.   Review of Systems Review of Systems  Constitutional: Negative for fever.  Musculoskeletal: Negative for neck pain.  All other systems reviewed and are negative.    Physical Exam Updated Vital Signs BP (!) 154/104 (BP Location: Right Arm)   Pulse 84   Temp 98.5 F (36.9 C) (Oral)   Resp 16   Ht 5\' 11"  (1.803 m)   Wt 88.9 kg   SpO2 97%   BMI 27.34 kg/m   Physical Exam Vitals signs and nursing note reviewed.  Constitutional:      Appearance: He is well-developed.  HENT:     Head: Normocephalic and atraumatic.     Mouth/Throat:     Mouth: Mucous membranes are dry.     Pharynx: Oropharynx is clear.  Eyes:     Extraocular Movements: Extraocular movements intact.     Conjunctiva/sclera: Conjunctivae normal.  Neck:     Musculoskeletal: Normal range of motion.  Cardiovascular:     Rate and Rhythm: Normal rate.  Pulmonary:     Effort: Pulmonary effort is normal. No respiratory distress.  Abdominal:     General: There is no  distension.  Musculoskeletal: Normal range of motion.        General: Tenderness (mild left calf ) present. No deformity or signs of injury.     Right lower leg: No edema.     Left lower leg: No edema.  Skin:    General: Skin is warm and dry.  Neurological:     Mental Status: He is alert.      ED Treatments / Results  Labs (all labs ordered are listed, but only abnormal results are displayed) Labs Reviewed - No data to display  EKG None  Radiology No results found.  Procedures Procedures (including critical care time)  Medications Ordered in ED Medications  ketorolac (TORADOL) injection 60 mg (has no administration in time range)     Initial Impression / Assessment and Plan / ED Course  I have reviewed the triage vital signs and the nursing notes.  Pertinent labs & imaging results that  were available during my care of the patient were reviewed by me and considered in my medical decision making (see chart for details).     Patient hurt his left calf while holding a couch and twisting.  Had a pain that shot down from the upper part of the back of his left lower leg down his calf towards his Achilles.  Patient can ambulate but after ambulating short distance the pain gets worse.  ibuProfen seem to help. Exam appropriate.  Low suspicion for DVT.  Mechanism is not really consistent with spinal fracture or other bony pathology so x-rays not really appropriate.  Will discharge with instructions for rice therapy and PCP follow-up if not improving in 3 to 5 days.  Final Clinical Impressions(s) / ED Diagnoses   Final diagnoses:  Strain of calf muscle, left, initial encounter    ED Discharge Orders    None       Nazeer Romney, Barbara Cower, MD 04/16/18 [phone]

## 2018-04-24 ENCOUNTER — Other Ambulatory Visit: Payer: Self-pay

## 2018-04-24 ENCOUNTER — Encounter (HOSPITAL_COMMUNITY): Payer: Self-pay | Admitting: Emergency Medicine

## 2018-04-24 ENCOUNTER — Emergency Department (HOSPITAL_COMMUNITY)
Admission: EM | Admit: 2018-04-24 | Discharge: 2018-04-25 | Disposition: A | Discharge status: Home / Self Care | Payer: Self-pay | Attending: Emergency Medicine | Admitting: Emergency Medicine

## 2018-04-24 DIAGNOSIS — F17210 Nicotine dependence, cigarettes, uncomplicated: Secondary | ICD-10-CM | POA: Insufficient documentation

## 2018-04-24 DIAGNOSIS — R1011 Right upper quadrant pain: Secondary | ICD-10-CM | POA: Insufficient documentation

## 2018-04-24 DIAGNOSIS — R1084 Generalized abdominal pain: Secondary | ICD-10-CM | POA: Insufficient documentation

## 2018-04-24 DIAGNOSIS — R42 Dizziness and giddiness: Secondary | ICD-10-CM | POA: Insufficient documentation

## 2018-04-24 DIAGNOSIS — E079 Disorder of thyroid, unspecified: Secondary | ICD-10-CM | POA: Insufficient documentation

## 2018-04-24 DIAGNOSIS — R112 Nausea with vomiting, unspecified: Secondary | ICD-10-CM | POA: Insufficient documentation

## 2018-04-24 LAB — COMPREHENSIVE METABOLIC PANEL
ALBUMIN: 3.5 g/dL (ref 3.5–5.0)
ALK PHOS: 42 U/L (ref 38–126)
ALT: 13 U/L (ref 0–44)
AST: 14 U/L — ABNORMAL LOW (ref 15–41)
Anion gap: 8 (ref 5–15)
BILIRUBIN TOTAL: 0.5 mg/dL (ref 0.3–1.2)
BUN: 9 mg/dL (ref 6–20)
CALCIUM: 8.8 mg/dL — AB (ref 8.9–10.3)
CO2: 25 mmol/L (ref 22–32)
CREATININE: 1.23 mg/dL (ref 0.61–1.24)
Chloride: 106 mmol/L (ref 98–111)
GFR calc Af Amer: >60 mL/min (ref >60)
GLUCOSE: 114 mg/dL — AB (ref 70–99)
POTASSIUM: 3.7 mmol/L (ref 3.5–5.1)
Sodium: 139 mmol/L (ref 135–145)
Total Protein: 6 g/dL — ABNORMAL LOW (ref 6.5–8.1)

## 2018-04-24 LAB — CBC
HCT: 45.2 % (ref 39.0–52.0)
Hemoglobin: 15.1 g/dL (ref 13.0–17.0)
MCH: 32.8 pg (ref 26.0–34.0)
MCHC: 33.4 g/dL (ref 30.0–36.0)
MCV: 98.3 fL (ref 80.0–100.0)
Platelets: 259 10*3/uL (ref 150–400)
RBC: 4.6 MIL/uL (ref 4.22–5.81)
RDW: 14.8 % (ref 11.5–15.5)
WBC: 11.7 10*3/uL — AB (ref 4.0–10.5)
nRBC: 0 % (ref 0.0–0.2)

## 2018-04-24 LAB — URINALYSIS, ROUTINE W REFLEX MICROSCOPIC
BILIRUBIN URINE: NEGATIVE
Glucose, UA: NEGATIVE mg/dL
HGB URINE DIPSTICK: NEGATIVE
KETONES UR: NEGATIVE mg/dL
Leukocytes, UA: NEGATIVE
Nitrite: NEGATIVE
Protein, ur: NEGATIVE mg/dL
SPECIFIC GRAVITY, URINE: 1.019 (ref 1.005–1.030)
pH: 5 (ref 5.0–8.0)

## 2018-04-24 LAB — LIPASE, BLOOD: Lipase: 27 U/L (ref 11–51)

## 2018-04-24 MED ORDER — SODIUM CHLORIDE 0.9% FLUSH
3.0000 mL | Freq: Once | INTRAVENOUS | Status: AC
  Administered 2018-04-25: 3 mL via INTRAVENOUS

## 2018-04-24 NOTE — ED Notes (Signed)
Pt not found in lobby, name called with no answer

## 2018-04-24 NOTE — ED Notes (Signed)
Pt called x3 times with no answer, staff searched outside as well as in the restrooms, pt is nowhere to be found.

## 2018-04-24 NOTE — ED Triage Notes (Signed)
C/o generalized abd pain, nausea, vomiting x 3, and dizziness x 2 hours.  States symptoms started 30 min after eating a [employer] and fries.  No neuro deficits noted on triage exam.

## 2018-04-24 NOTE — ED Provider Notes (Signed)
Pt LWBS prior to being placed in a room. No evaluation performed by me.    Bailey Kolbe, PA-C 01/Alveria Apley24/20 2047    Vanetta MuldersZackowski, Scott, MD 04/26/18 [phone]

## 2018-04-24 NOTE — ED Notes (Signed)
Pt. Not answering for room   

## 2018-04-25 ENCOUNTER — Emergency Department (HOSPITAL_COMMUNITY): Payer: Self-pay

## 2018-04-25 MED ORDER — SODIUM CHLORIDE 0.9 % IV BOLUS
1000.0000 mL | Freq: Once | INTRAVENOUS | Status: AC
  Administered 2018-04-25: 1000 mL via INTRAVENOUS

## 2018-04-25 MED ORDER — METOCLOPRAMIDE HCL 5 MG/ML IJ SOLN
10.0000 mg | Freq: Once | INTRAMUSCULAR | Status: AC
  Administered 2018-04-25: 10 mg via INTRAVENOUS
  Filled 2018-04-25: qty 2

## 2018-04-25 MED ORDER — METOCLOPRAMIDE HCL 10 MG PO TABS: 10.0000 mg | ORAL_TABLET | Freq: Four times a day (QID) | ORAL | 0 refills | Status: DC

## 2018-04-25 NOTE — ED Notes (Signed)
Patient transported to Ultrasound 

## 2018-04-25 NOTE — Discharge Instructions (Addendum)
Take Reglan for nausea and abdominal discomfort. Return to the ED with any high fever, severe pain or new concern.

## 2018-04-25 NOTE — ED Provider Notes (Signed)
[name] EMERGENCY DEPARTMENT Provider Note   CSN: [id number] Arrival date & time: 04/24/18  1853     History   Chief Complaint Chief Complaint  Patient presents with  . Abdominal Pain  . Nausea  . Dizziness    HPI Randy Webster is a 43 y.o. male.  Patient to ED with complaint of abdominal pain that started about 1 1/2 hours after eating at [employer], which was about 4 hours ago. He had nausea with 2-3 episodes vomiting. Pain has been persistent since onset. No fever, or diarrhea. No history of similar symptoms happening before. No chest pain or shortness of breath. He reports that since onset of pain, he has felt lightheaded and dizzy.   The history is provided by the patient. No language interpreter was used.  Abdominal Pain  Associated symptoms: nausea   Associated symptoms: no chest pain, no diarrhea, no fever and no shortness of breath   Dizziness  Associated symptoms: nausea   Associated symptoms: no chest pain, no diarrhea and no shortness of breath     Past Medical History:  Diagnosis Date  . Thyroid disease     There are no active problems to display for this patient.   History reviewed. No pertinent surgical history.      Home Medications    Prior to Admission medications   Medication Sig Start Date End Date Taking? Authorizing Provider  furosemide (LASIX) 20 MG tablet Take 1 tablet (20 mg total) by mouth daily. 10/11/17   McDonald, Mia A, PA-C  ibuprofen (ADVIL,MOTRIN) 200 MG tablet Take 800 mg by mouth every 4 (four) hours as needed for mild pain.    [provider]  levothyroxine (SYNTHROID, LEVOTHROID) 50 MCG tablet Take 1 tablet (50 mcg total) by mouth daily before breakfast. 12/14/17 01/13/18  Curatolo, Adam, DO  meclizine (ANTIVERT) 25 MG tablet Take 1 tablet (25 mg total) by mouth 3 (three) times daily as needed for up to 20 doses for dizziness. 12/14/17   Curatolo, Adam, DO  potassium chloride SA (K-DUR,KLOR-CON) 20 MEQ  tablet Take 1 tablet (20 mEq total) by mouth 2 (two) times daily for 7 days. 10/11/17 12/14/17  McDonald, Pedro Earls A, PA-C    Family History No family history on file.  Social History Social History   Tobacco Use  . Smoking status: Current Every Day Smoker    Packs/day: 0.50    Types: Cigarettes  . Smokeless tobacco: Never Used  Substance Use Topics  . Alcohol use: Yes  . Drug use: No     Allergies   Patient has no known allergies.   Review of Systems Review of Systems  Constitutional: Negative for fever.  Respiratory: Negative for shortness of breath.   Cardiovascular: Negative for chest pain.  Gastrointestinal: Positive for abdominal pain and nausea. Negative for diarrhea.  Musculoskeletal: Negative for myalgias.  Neurological: Positive for dizziness. Negative for syncope.     Physical Exam Updated Vital Signs BP 115/71 (BP Location: Right Arm)   Pulse 75   Temp 98.2 F (36.8 C) (Oral)   Resp 17   SpO2 94%   Physical Exam Vitals signs and nursing note reviewed.  Constitutional:      Appearance: He is well-developed.  HENT:     Head: Normocephalic.  Neck:     Musculoskeletal: Normal range of motion and neck supple.  Cardiovascular:     Rate and Rhythm: Normal rate and regular rhythm.  Pulmonary:     Effort: Pulmonary effort  is normal.     Breath sounds: Normal breath sounds.  Abdominal:     General: Bowel sounds are normal.     Palpations: Abdomen is soft.     Tenderness: There is abdominal tenderness in the right upper quadrant. There is no guarding or rebound.     Comments: Abdominal tenderness is diffuse but worse in the RUQ.  Musculoskeletal: Normal range of motion.  Skin:    General: Skin is warm and dry.     Findings: No rash.  Neurological:     General: No focal deficit present.     Mental Status: He is alert.      ED Treatments / Results  Labs (all labs ordered are listed, but only abnormal results are displayed) Labs Reviewed    COMPREHENSIVE METABOLIC PANEL - Abnormal; Notable for the following components:      Result Value   Glucose, Bld 114 (*)    Calcium 8.8 (*)    Total Protein 6.0 (*)    AST 14 (*)    All other components within normal limits  CBC - Abnormal; Notable for the following components:   WBC 11.7 (*)    All other components within normal limits  LIPASE, BLOOD  URINALYSIS, ROUTINE W REFLEX MICROSCOPIC    EKG None  Radiology No results found.  Procedures Procedures (including critical care time)  Medications Ordered in ED Medications  sodium chloride flush (NS) 0.9 % injection 3 mL (has no administration in time range)  sodium chloride 0.9 % bolus 1,000 mL (has no administration in time range)  metoCLOPramide (REGLAN) injection 10 mg (has no administration in time range)     Initial Impression / Assessment and Plan / ED Course  I have reviewed the triage vital signs and the nursing notes.  Pertinent labs & imaging results that were available during my care of the patient were reviewed by me and considered in my medical decision making (see chart for details).     Patient to ED with abdominal pain, greatest in RUQ, N, V that started after eating a high fat meal. No fever. He feels dizzy and lightheaded since.   Labs, VS reassuring. Will obtain RUQ [place] to evaluate for gall stones. He is given a liter of fluids to address dizziness, Reglan for pain and nausea. Will observe.   Patient feeling improved after medication and fluids. [place] negative for acute finding. He is felt appropriate for discharge home.   Final Clinical Impressions(s) / ED Diagnoses   Final diagnoses:  None   1. Abdominal pain 2. Nausea, vomiting  ED Discharge Orders    None       Elpidio Anis, PA-C 04/25/18 0131    Derwood Kaplan, MD 04/25/18 [phone]

## 2018-05-06 ENCOUNTER — Encounter (HOSPITAL_COMMUNITY): Payer: Self-pay | Admitting: Emergency Medicine

## 2018-05-06 ENCOUNTER — Emergency Department (HOSPITAL_COMMUNITY)
Admission: EM | Admit: 2018-05-06 | Discharge: 2018-05-07 | Disposition: A | Discharge status: Home / Self Care | Payer: Self-pay | Attending: Emergency Medicine | Admitting: Emergency Medicine

## 2018-05-06 DIAGNOSIS — Z79899 Other long term (current) drug therapy: Secondary | ICD-10-CM | POA: Insufficient documentation

## 2018-05-06 DIAGNOSIS — F17210 Nicotine dependence, cigarettes, uncomplicated: Secondary | ICD-10-CM | POA: Insufficient documentation

## 2018-05-06 DIAGNOSIS — G43809 Other migraine, not intractable, without status migrainosus: Secondary | ICD-10-CM | POA: Insufficient documentation

## 2018-05-06 DIAGNOSIS — M62838 Other muscle spasm: Secondary | ICD-10-CM | POA: Insufficient documentation

## 2018-05-06 MED ORDER — DIPHENHYDRAMINE HCL 50 MG/ML IJ SOLN
25.0000 mg | Freq: Once | INTRAMUSCULAR | Status: AC
  Administered 2018-05-06: 25 mg via INTRAVENOUS
  Filled 2018-05-06: qty 1

## 2018-05-06 MED ORDER — SODIUM CHLORIDE 0.9 % IV BOLUS
1000.0000 mL | Freq: Once | INTRAVENOUS | Status: AC
  Administered 2018-05-06: 1000 mL via INTRAVENOUS

## 2018-05-06 MED ORDER — PROCHLORPERAZINE EDISYLATE 10 MG/2ML IJ SOLN
10.0000 mg | Freq: Once | INTRAMUSCULAR | Status: AC
  Administered 2018-05-06: 10 mg via INTRAVENOUS
  Filled 2018-05-06: qty 2

## 2018-05-06 MED ORDER — KETOROLAC TROMETHAMINE 30 MG/ML IJ SOLN
30.0000 mg | Freq: Once | INTRAMUSCULAR | Status: AC
  Administered 2018-05-06: 30 mg via INTRAVENOUS
  Filled 2018-05-06: qty 1

## 2018-05-06 MED ORDER — DEXAMETHASONE SODIUM PHOSPHATE 10 MG/ML IJ SOLN
10.0000 mg | Freq: Once | INTRAMUSCULAR | Status: AC
  Administered 2018-05-06: 10 mg via INTRAVENOUS
  Filled 2018-05-06: qty 1

## 2018-05-06 NOTE — ED Provider Notes (Signed)
[facility]   CSN: [id number] Arrival date & time: 05/06/18  2112     History   Chief Complaint Chief Complaint  Patient presents with  . Migraine    HPI Randy Webster is a 43 y.o. male.  The history is provided by the patient, medical records and the spouse. No language interpreter was used.  Migraine  This is a recurrent problem. The current episode started 2 days ago. The problem occurs constantly. The problem has not changed since onset.Pertinent negatives include no abdominal pain, no headaches and no shortness of breath. Nothing (Bright lights and loud noises) aggravates the symptoms. Nothing relieves the symptoms. He has tried nothing for the symptoms. The treatment provided no relief.    Past Medical History:  Diagnosis Date  . Thyroid disease     There are no active problems to display for this patient.   History reviewed. No pertinent surgical history.      Home Medications    Prior to Admission medications   Medication Sig Start Date End Date Taking? Authorizing Provider  levothyroxine (SYNTHROID, LEVOTHROID) 125 MCG tablet Take 125 mcg by mouth daily before breakfast.   Yes [provider]  furosemide (LASIX) 20 MG tablet Take 1 tablet (20 mg total) by mouth daily. Patient not taking: Reported on 04/25/2018 10/11/17   McDonald, Pedro EarlsMia A, PA-C  levothyroxine (SYNTHROID, LEVOTHROID) 50 MCG tablet Take 1 tablet (50 mcg total) by mouth daily before breakfast. Patient not taking: Reported on 04/25/2018 12/14/17 04/25/26  Virgina Norfolkuratolo, Adam, DO  meclizine (ANTIVERT) 25 MG tablet Take 1 tablet (25 mg total) by mouth 3 (three) times daily as needed for up to 20 doses for dizziness. Patient not taking: Reported on 04/25/2018 12/14/17   Virgina Norfolkuratolo, Adam, DO  metoCLOPramide (REGLAN) 10 MG tablet Take 1 tablet (10 mg total) by mouth every 6 (six) hours. Patient not taking: Reported on 05/06/2018 04/25/18   Elpidio AnisUpstill, Shari, PA-C    potassium chloride SA (K-DUR,KLOR-CON) 20 MEQ tablet Take 1 tablet (20 mEq total) by mouth 2 (two) times daily for 7 days. Patient not taking: Reported on 04/25/2018 10/11/17 04/25/26  Barkley BoardsMcDonald, Mia A, PA-C    Family History No family history on file.  Social History Social History   Tobacco Use  . Smoking status: Current Every Day Smoker    Packs/day: 0.50    Types: Cigarettes  . Smokeless tobacco: Never Used  Substance Use Topics  . Alcohol use: Yes  . Drug use: No     Allergies   Patient has no known allergies.   Review of Systems Review of Systems  Constitutional: Negative for chills, diaphoresis, fatigue and fever.  HENT: Negative for congestion.   Eyes: Positive for photophobia. Negative for redness and visual disturbance.  Respiratory: Negative for cough, chest tightness, shortness of breath and wheezing.   Gastrointestinal: Negative for abdominal pain, constipation, diarrhea, nausea and vomiting.  Genitourinary: Negative for dysuria and flank pain.  Musculoskeletal: Positive for back pain (right low back pain with spaszm). Negative for neck pain and neck stiffness.  Neurological: Negative for seizures, weakness, light-headedness, numbness and headaches.  Psychiatric/Behavioral: Negative for agitation.  All other systems reviewed and are negative.    Physical Exam Updated Vital Signs BP 135/89   Pulse 70   Temp 98.5 F (36.9 C) (Oral)   Resp 18   Ht 5\' 11"  (1.803 m)   Wt 83.9 kg   SpO2 95%   BMI 25.80 kg/m  Physical Exam Vitals signs and nursing note reviewed.  Constitutional:      General: He is not in acute distress.    Appearance: He is well-developed. He is not ill-appearing, toxic-appearing or diaphoretic.  HENT:     Head: Normocephalic and atraumatic.     Nose: No congestion or rhinorrhea.     Mouth/Throat:     Pharynx: No oropharyngeal exudate or posterior oropharyngeal erythema.  Eyes:     Extraocular Movements: Extraocular movements  intact.     Conjunctiva/sclera: Conjunctivae normal.  Neck:     Musculoskeletal: Neck supple. No neck rigidity or muscular tenderness.  Cardiovascular:     Rate and Rhythm: Normal rate and regular rhythm.     Pulses: Normal pulses.     Heart sounds: Normal heart sounds. No murmur.  Pulmonary:     Effort: Pulmonary effort is normal. No respiratory distress.     Breath sounds: Normal breath sounds. No wheezing, rhonchi or rales.  Chest:     Chest wall: No tenderness.  Abdominal:     General: Abdomen is flat. There is no distension.     Palpations: Abdomen is soft.     Tenderness: There is no abdominal tenderness.  Musculoskeletal:        General: Tenderness present.     Lumbar back: He exhibits tenderness, pain and spasm.       Back:     Right lower leg: No edema.     Left lower leg: No edema.  Skin:    General: Skin is warm and dry.     Capillary Refill: Capillary refill takes less than 2 seconds.     Findings: No erythema.  Neurological:     General: No focal deficit present.     Mental Status: He is alert and oriented to person, place, and time.     Sensory: No sensory deficit.     Motor: No weakness.     Coordination: Coordination normal.  Psychiatric:        Mood and Affect: Mood normal.      ED Treatments / Results  Labs (all labs ordered are listed, but only abnormal results are displayed) Labs Reviewed - No data to display  EKG None  Radiology No results found.  Procedures Procedures (including critical care time)  Medications Ordered in ED Medications  sodium chloride 0.9 % bolus 1,000 mL (0 mLs Intravenous Stopped 05/07/18 0001)  ketorolac (TORADOL) 30 MG/ML injection 30 mg (30 mg Intravenous Given 05/06/18 2250)  prochlorperazine (COMPAZINE) injection 10 mg (10 mg Intravenous Given 05/06/18 2252)  diphenhydrAMINE (BENADRYL) injection 25 mg (25 mg Intravenous Given 05/06/18 2249)  dexamethasone (DECADRON) injection 10 mg (10 mg Intravenous Given 05/06/18  2251)     Initial Impression / Assessment and Plan / ED Course  I have reviewed the triage vital signs and the nursing notes.  Pertinent labs & imaging results that were available during my care of the patient were reviewed by me and considered in my medical decision making (see chart for details).     Randy Webster is a 43 y.o. male with past medical history significant for thyroid disease and migraines who presents with headache.  Patient reports that he has had migraine headache for the last 2 days.  He reports associated photophobia and photophobia.  He reports it is a throbbing and stabbing pain across the top of his head similar to prior.  He reports the pain is extremely severe and has not worked with home  medications.  He denies any head trauma or neck trauma.  He does report that he lifted something heavy earlier for a friend and pulled the right side of his back.  He reports muscle spasms and pain in his back.  He denies numbness, tingling, weakness of extremities.  No vision changes, nausea, or vomiting.  No other concerning symptoms such as fevers, chills.  On exam, no focal neurologic deficits.  Patient did have muscle spasms on his right lower back that was tender to palpation.  Unremarkable exam otherwise.  Lungs clear chest nontender.  Due to similar to prior migraines, has photophobia, and his exam, we feel this is a migraine.  Patient will be given a headache cocktail to try and alleviate his symptoms.  Patient reports headache went from a 9 out of 10 to a 2 out of 10.  Patient is feeling much better.  Patient will be allowed to eat and drink.  He will continue his fluids.  After fluids have completed, patient we discharged home with prescription for muscle relaxant for his back spasms.  Patient will follow-up with his PCP for further headache management and muscle spasm management.  Patient will be discharged in good condition.   Final Clinical Impressions(s) / ED Diagnoses    Final diagnoses:  Other migraine without status migrainosus, not intractable  Muscle spasm    ED Discharge Orders         Ordered    cyclobenzaprine (FLEXERIL) 5 MG tablet  2 times daily PRN     05/07/18 0007          Clinical Impression: 1. Other migraine without status migrainosus, not intractable   2. Muscle spasm     Disposition: Discharge  Condition: Good  I have discussed the results, Dx and Tx plan with the pt(& family if present). He/she/they expressed understanding and agree(s) with the plan. Discharge instructions discussed at great length. Strict return precautions discussed and pt &/or family have verbalized understanding of the instructions. No further questions at time of discharge.    New Prescriptions   CYCLOBENZAPRINE (FLEXERIL) 5 MG TABLET    Take 1 tablet (5 mg total) by mouth 2 (two) times daily as needed for muscle spasms.    Follow Up: [facility] [address] [place] [address] [phone] Schedule an appointment as soon as possible for a visit    [name] EMERGENCY DEPARTMENT [address] [id number] mc [place] [address] [phone]       , Canary Brim, MD 05/07/18 [phone]

## 2018-05-06 NOTE — ED Triage Notes (Signed)
Pt reports migraine X2 days with photosensitivity. Took OTC meds with no relief. States hx of migraines.

## 2018-05-07 ENCOUNTER — Emergency Department (HOSPITAL_COMMUNITY)
Admission: EM | Admit: 2018-05-07 | Discharge: 2018-05-08 | Disposition: A | Discharge status: Home / Self Care | Payer: Self-pay | Attending: Emergency Medicine | Admitting: Emergency Medicine

## 2018-05-07 ENCOUNTER — Encounter (HOSPITAL_COMMUNITY): Payer: Self-pay

## 2018-05-07 ENCOUNTER — Other Ambulatory Visit: Payer: Self-pay

## 2018-05-07 DIAGNOSIS — F17210 Nicotine dependence, cigarettes, uncomplicated: Secondary | ICD-10-CM | POA: Insufficient documentation

## 2018-05-07 DIAGNOSIS — F329 Major depressive disorder, single episode, unspecified: Secondary | ICD-10-CM

## 2018-05-07 DIAGNOSIS — F419 Anxiety disorder, unspecified: Secondary | ICD-10-CM | POA: Insufficient documentation

## 2018-05-07 DIAGNOSIS — Z79899 Other long term (current) drug therapy: Secondary | ICD-10-CM | POA: Insufficient documentation

## 2018-05-07 DIAGNOSIS — F332 Major depressive disorder, recurrent severe without psychotic features: Secondary | ICD-10-CM | POA: Insufficient documentation

## 2018-05-07 DIAGNOSIS — F32A Depression, unspecified: Secondary | ICD-10-CM

## 2018-05-07 HISTORY — DX: Depression, unspecified: F32.A

## 2018-05-07 HISTORY — DX: Anxiety disorder, unspecified: F41.9

## 2018-05-07 HISTORY — DX: Major depressive disorder, single episode, unspecified: F32.9

## 2018-05-07 LAB — CBC
HCT: 42.4 % (ref 39.0–52.0)
Hemoglobin: 13.9 g/dL (ref 13.0–17.0)
MCH: 32.7 pg (ref 26.0–34.0)
MCHC: 32.8 g/dL (ref 30.0–36.0)
MCV: 99.8 fL (ref 80.0–100.0)
Platelets: 288 10*3/uL (ref 150–400)
RBC: 4.25 MIL/uL (ref 4.22–5.81)
RDW: 14.6 % (ref 11.5–15.5)
WBC: 13.6 10*3/uL — ABNORMAL HIGH (ref 4.0–10.5)
nRBC: 0 % (ref 0.0–0.2)

## 2018-05-07 LAB — COMPREHENSIVE METABOLIC PANEL
ALT: 14 U/L (ref 0–44)
AST: 15 U/L (ref 15–41)
Albumin: 3.7 g/dL (ref 3.5–5.0)
Alkaline Phosphatase: 46 U/L (ref 38–126)
Anion gap: 6 (ref 5–15)
BUN: 14 mg/dL (ref 6–20)
CHLORIDE: 109 mmol/L (ref 98–111)
CO2: 24 mmol/L (ref 22–32)
Calcium: 8.8 mg/dL — ABNORMAL LOW (ref 8.9–10.3)
Creatinine, Ser: 1.07 mg/dL (ref 0.61–1.24)
GFR calc Af Amer: >60 mL/min (ref >60)
GFR calc non Af Amer: >60 mL/min (ref >60)
Glucose, Bld: 102 mg/dL — ABNORMAL HIGH (ref 70–99)
Potassium: 3.9 mmol/L (ref 3.5–5.1)
Sodium: 139 mmol/L (ref 135–145)
Total Bilirubin: 0.6 mg/dL (ref 0.3–1.2)
Total Protein: 6.6 g/dL (ref 6.5–8.1)

## 2018-05-07 LAB — RAPID URINE DRUG SCREEN, HOSP PERFORMED
Amphetamines: NOT DETECTED
BARBITURATES: NOT DETECTED
Benzodiazepines: NOT DETECTED
Cocaine: NOT DETECTED
Opiates: NOT DETECTED
Tetrahydrocannabinol: POSITIVE — AB

## 2018-05-07 LAB — SALICYLATE LEVEL: Salicylate Lvl: <7 mg/dL (ref 2.8–30.0)

## 2018-05-07 LAB — ACETAMINOPHEN LEVEL: Acetaminophen (Tylenol), Serum: <10 ug/mL — ABNORMAL LOW (ref 10–30)

## 2018-05-07 LAB — ETHANOL: Alcohol, Ethyl (B): <10 mg/dL (ref <10)

## 2018-05-07 MED ORDER — LEVOTHYROXINE SODIUM 125 MCG PO TABS
125.0000 ug | ORAL_TABLET | Freq: Every day | ORAL | Status: DC
  Administered 2018-05-08: 125 ug via ORAL
  Filled 2018-05-07: qty 1

## 2018-05-07 MED ORDER — CYCLOBENZAPRINE HCL 5 MG PO TABS: 5.0000 mg | ORAL_TABLET | Freq: Two times a day (BID) | ORAL | 0 refills | Status: DC | PRN

## 2018-05-07 NOTE — ED Notes (Signed)
Bed: WTR6 Expected date:  Expected time:  Means of arrival:  Comments: 

## 2018-05-07 NOTE — ED Triage Notes (Signed)
Pt states that he has struggled with depression over the years but it has gotten worse recently. Pt states that he had sudden thoughts of hurting himself earlier tonight which he states is not normal for him. Pt denies having a plan to hurt himself.

## 2018-05-07 NOTE — ED Provider Notes (Addendum)
[facility]-EMERGENCY DEPT Provider Note   CSN: [id number] Arrival date & time: 05/07/18  2152     History   Chief Complaint Chief Complaint  Patient presents with  . Suicidal    HPI Randy Webster is a 43 y.o. male.  HPI Patient presents with depression and worsening moods.  Has had issues with depression over the years but worse recently.  States he was suicidal earlier today with that is cleared up somewhat.  States he smoked a little marijuana last week.  Will rarely drink alcohol.  Had history of anxiety years ago but states he is on no medicine for that or depression.  Things have gotten worse recently.  States the anniversary of his mother's death is coming up also states that is getting to him.  Had a recent miscarriage. Past Medical History:  Diagnosis Date  . Anxiety   . Depression   . Thyroid disease     There are no active problems to display for this patient.   History reviewed. No pertinent surgical history.      Home Medications    Prior to Admission medications   Medication Sig Start Date End Date Taking? Authorizing Provider  levothyroxine (SYNTHROID, LEVOTHROID) 125 MCG tablet Take 125 mcg by mouth daily before breakfast.   Yes [provider]  cyclobenzaprine (FLEXERIL) 5 MG tablet Take 1 tablet (5 mg total) by mouth 2 (two) times daily as needed for muscle spasms. 05/07/18   Tegeler, Canary Brim, MD  furosemide (LASIX) 20 MG tablet Take 1 tablet (20 mg total) by mouth daily. Patient not taking: Reported on 04/25/2018 10/11/17   McDonald, Pedro Earls A, PA-C  levothyroxine (SYNTHROID, LEVOTHROID) 50 MCG tablet Take 1 tablet (50 mcg total) by mouth daily before breakfast. Patient not taking: Reported on 04/25/2018 12/14/17 04/25/26  Virgina Norfolk, DO  meclizine (ANTIVERT) 25 MG tablet Take 1 tablet (25 mg total) by mouth 3 (three) times daily as needed for up to 20 doses for dizziness. Patient not taking: Reported on 04/25/2018 12/14/17    Virgina Norfolk, DO  metoCLOPramide (REGLAN) 10 MG tablet Take 1 tablet (10 mg total) by mouth every 6 (six) hours. Patient not taking: Reported on 05/06/2018 04/25/18   Elpidio Anis, PA-C  potassium chloride SA (K-DUR,KLOR-CON) 20 MEQ tablet Take 1 tablet (20 mEq total) by mouth 2 (two) times daily for 7 days. Patient not taking: Reported on 04/25/2018 10/11/17 04/25/26  Barkley Boards, PA-C    Family History History reviewed. No pertinent family history.  Social History Social History   Tobacco Use  . Smoking status: Current Every Day Smoker    Packs/day: 0.50    Types: Cigarettes  . Smokeless tobacco: Never Used  Substance Use Topics  . Alcohol use: Yes  . Drug use: No     Allergies   Patient has no known allergies.   Review of Systems Review of Systems  Constitutional: Positive for appetite change.  HENT: Negative for congestion.   Respiratory: Negative for shortness of breath.   Cardiovascular: Negative for chest pain.  Gastrointestinal: Negative for abdominal pain.  Genitourinary: Negative for frequency.  Neurological: Negative for speech difficulty.  Psychiatric/Behavioral: Positive for dysphoric mood and suicidal ideas. The patient is nervous/anxious.      Physical Exam Updated Vital Signs BP (!) 162/91 (BP Location: Left Arm)   Pulse 82   Temp 99 F (37.2 C) (Oral)   Resp 16   Ht 5\' 11"  (1.803 m)   Wt 83.9  kg   SpO2 95%   BMI 25.80 kg/m   Physical Exam HENT:     Head: Atraumatic.     Mouth/Throat:     Mouth: Mucous membranes are moist.  Eyes:     Extraocular Movements: Extraocular movements intact.  Neck:     Musculoskeletal: Neck supple.  Cardiovascular:     Rate and Rhythm: Normal rate.  Pulmonary:     Breath sounds: Normal breath sounds.  Abdominal:     Palpations: Abdomen is soft.  Musculoskeletal:     Right lower leg: No edema.     Left lower leg: No edema.  Skin:    General: Skin is warm.     Comments: Patient has rather dark  complexion.  States he is [place].  Neurological:     General: No focal deficit present.     Mental Status: He is alert.  Psychiatric:        Mood and Affect: Mood normal.      ED Treatments / Results  Labs (all labs ordered are listed, but only abnormal results are displayed) Labs Reviewed  COMPREHENSIVE METABOLIC PANEL - Abnormal; Notable for the following components:      Result Value   Glucose, Bld 102 (*)    Calcium 8.8 (*)    All other components within normal limits  ACETAMINOPHEN LEVEL - Abnormal; Notable for the following components:   Acetaminophen (Tylenol), Serum <10 (*)    All other components within normal limits  CBC - Abnormal; Notable for the following components:   WBC 13.6 (*)    All other components within normal limits  RAPID URINE DRUG SCREEN, HOSP PERFORMED - Abnormal; Notable for the following components:   Tetrahydrocannabinol POSITIVE (*)    All other components within normal limits  ETHANOL  SALICYLATE LEVEL  TSH    EKG None  Radiology No results found.  Procedures Procedures (including critical care time)  Medications Ordered in ED Medications  levothyroxine (SYNTHROID, LEVOTHROID) tablet 125 mcg (has no administration in time range)     Initial Impression / Assessment and Plan / ED Course  I have reviewed the triage vital signs and the nursing notes.  Pertinent labs & imaging results that were available during my care of the patient were reviewed by me and considered in my medical decision making (see chart for details).  Clinical Course as of May 08 2343  Thu May 07, 2018  2336 SI earlier today, depressed, miscarriage, other triggers, better now, still depressed, TTS consult, fu TSH, fu tts recs   [MB]    Clinical Course User Index [MB] Pilar Plate Elmer Sow, MD    Patient presents with anxiety and depression.  Was suicidal earlier today but states that is clearing up.  Not on any medications for.  Medically cleared but now added  on TSH finding that he is history of thyroid disease.  Medically cleared but care turned over to Dr. Pilar Plate, who can follow the TSH.   Final Clinical Impressions(s) / ED Diagnoses   Final diagnoses:  Depression, unspecified depression type    ED Discharge Orders    None       Benjiman Core, MD 05/07/18 2325    Benjiman Core, MD 05/07/18 [phone]

## 2018-05-07 NOTE — Discharge Instructions (Signed)
Your history and exam were consistent with migraine headache.  As her headache improved, we feel you are safe for discharge home.  We did find evidence of muscle spasm on your exam likely from the lifting you did earlier.  There is no midline tenderness requiring imaging.  Please use the muscle relaxant to help with the muscle spasms and stay hydrated.  Please follow-up with your primary doctor.  If any symptoms change or worsen, please return to the nearest emergency department.

## 2018-05-08 ENCOUNTER — Encounter (HOSPITAL_COMMUNITY): Payer: Self-pay | Admitting: Emergency Medicine

## 2018-05-08 ENCOUNTER — Emergency Department (HOSPITAL_COMMUNITY)
Admission: EM | Admit: 2018-05-08 | Discharge: 2018-05-09 | Disposition: A | Discharge status: Other Acute Inpatient Hospital | Payer: Self-pay | Attending: Emergency Medicine | Admitting: Emergency Medicine

## 2018-05-08 ENCOUNTER — Other Ambulatory Visit: Payer: Self-pay

## 2018-05-08 DIAGNOSIS — F17210 Nicotine dependence, cigarettes, uncomplicated: Secondary | ICD-10-CM | POA: Insufficient documentation

## 2018-05-08 DIAGNOSIS — R45851 Suicidal ideations: Secondary | ICD-10-CM | POA: Insufficient documentation

## 2018-05-08 DIAGNOSIS — F332 Major depressive disorder, recurrent severe without psychotic features: Secondary | ICD-10-CM | POA: Insufficient documentation

## 2018-05-08 LAB — ETHANOL: Alcohol, Ethyl (B): <10 mg/dL (ref <10)

## 2018-05-08 LAB — ACETAMINOPHEN LEVEL: Acetaminophen (Tylenol), Serum: <10 ug/mL — ABNORMAL LOW (ref 10–30)

## 2018-05-08 LAB — RAPID URINE DRUG SCREEN, HOSP PERFORMED
Amphetamines: NOT DETECTED
Barbiturates: NOT DETECTED
Benzodiazepines: NOT DETECTED
COCAINE: NOT DETECTED
Opiates: NOT DETECTED
Tetrahydrocannabinol: POSITIVE — AB

## 2018-05-08 LAB — COMPREHENSIVE METABOLIC PANEL
ALT: 14 U/L (ref 0–44)
AST: 19 U/L (ref 15–41)
Albumin: 3.7 g/dL (ref 3.5–5.0)
Alkaline Phosphatase: 53 U/L (ref 38–126)
Anion gap: 12 (ref 5–15)
BILIRUBIN TOTAL: 0.3 mg/dL (ref 0.3–1.2)
BUN: 13 mg/dL (ref 6–20)
CO2: 24 mmol/L (ref 22–32)
Calcium: 9.5 mg/dL (ref 8.9–10.3)
Chloride: 104 mmol/L (ref 98–111)
Creatinine, Ser: 1.17 mg/dL (ref 0.61–1.24)
GFR calc Af Amer: >60 mL/min (ref >60)
GFR calc non Af Amer: >60 mL/min (ref >60)
GLUCOSE: 96 mg/dL (ref 70–99)
Potassium: 4.1 mmol/L (ref 3.5–5.1)
Sodium: 140 mmol/L (ref 135–145)
Total Protein: 6.6 g/dL (ref 6.5–8.1)

## 2018-05-08 LAB — CBC
HEMATOCRIT: 45.6 % (ref 39.0–52.0)
Hemoglobin: 15.1 g/dL (ref 13.0–17.0)
MCH: 32.5 pg (ref 26.0–34.0)
MCHC: 33.1 g/dL (ref 30.0–36.0)
MCV: 98.1 fL (ref 80.0–100.0)
Platelets: 319 10*3/uL (ref 150–400)
RBC: 4.65 MIL/uL (ref 4.22–5.81)
RDW: 14.5 % (ref 11.5–15.5)
WBC: 10.8 10*3/uL — AB (ref 4.0–10.5)
nRBC: 0 % (ref 0.0–0.2)

## 2018-05-08 LAB — SALICYLATE LEVEL: Salicylate Lvl: <7 mg/dL (ref 2.8–30.0)

## 2018-05-08 LAB — TSH: TSH: 187.629 u[IU]/mL — ABNORMAL HIGH (ref 0.350–4.500)

## 2018-05-08 MED ORDER — TRAZODONE HCL 50 MG PO TABS
50.0000 mg | ORAL_TABLET | Freq: Every day | ORAL | Status: DC
  Administered 2018-05-08: 50 mg via ORAL
  Filled 2018-05-08: qty 1

## 2018-05-08 MED ORDER — FUROSEMIDE 20 MG PO TABS
20.0000 mg | ORAL_TABLET | Freq: Every day | ORAL | Status: DC
  Administered 2018-05-09: 20 mg via ORAL
  Filled 2018-05-08: qty 1

## 2018-05-08 MED ORDER — LEVOTHYROXINE SODIUM 50 MCG PO TABS
50.0000 ug | ORAL_TABLET | Freq: Every day | ORAL | Status: DC
  Administered 2018-05-09: 50 ug via ORAL
  Filled 2018-05-08: qty 1

## 2018-05-08 NOTE — ED Provider Notes (Signed)
[name] EMERGENCY DEPARTMENT Provider Note   CSN: [id number] Arrival date & time: 05/08/18  2106     History   Chief Complaint Chief Complaint  Patient presents with  . Suicidal    HPI Randy Webster is a 43 y.o. male.  Patient presents to the emergency department with a chief complaint of suicidal thoughts.  He reports having associated depression and anxiety from the time that his mother passed away approximately 20 years ago.  States that he has felt increasingly depressed over the past couple of days.  Was seen yesterday at [facility], and states that he thought about killing himself by slitting his wrists with a razor blade.  He was seen at [facility] this morning, and was prescribed Cymbalta and trazodone, but states that he was unable to fill these prescriptions.  He was told to return to the emergency department if his symptoms began to worsen.  He states that they did begin to worsen again tonight, and this brought him back to the ER again.  Denies any recent illnesses.  Denies any alcohol use.  States that he did smoke marijuana a few days ago, but otherwise denies any drug use.  Denies any auditory or visual hallucinations.  The history is provided by the patient. No language interpreter was used.    Past Medical History:  Diagnosis Date  . Anxiety   . Depression   . Thyroid disease     There are no active problems to display for this patient.   History reviewed. No pertinent surgical history.      Home Medications    Prior to Admission medications   Medication Sig Start Date End Date Taking? Authorizing Provider  levothyroxine (SYNTHROID, LEVOTHROID) 125 MCG tablet Take 125 mcg by mouth daily before breakfast.   Yes [provider]  cyclobenzaprine (FLEXERIL) 5 MG tablet Take 1 tablet (5 mg total) by mouth 2 (two) times daily as needed for muscle spasms. 05/07/18   Tegeler, Canary Brim, MD  furosemide (LASIX) 20 MG tablet Take 1 tablet  (20 mg total) by mouth daily. Patient not taking: Reported on 04/25/2018 10/11/17   McDonald, Pedro Earls A, PA-C  levothyroxine (SYNTHROID, LEVOTHROID) 50 MCG tablet Take 1 tablet (50 mcg total) by mouth daily before breakfast. Patient not taking: Reported on 04/25/2018 12/14/17 04/25/26  Virgina Norfolk, DO  meclizine (ANTIVERT) 25 MG tablet Take 1 tablet (25 mg total) by mouth 3 (three) times daily as needed for up to 20 doses for dizziness. Patient not taking: Reported on 04/25/2018 12/14/17   Virgina Norfolk, DO  metoCLOPramide (REGLAN) 10 MG tablet Take 1 tablet (10 mg total) by mouth every 6 (six) hours. Patient not taking: Reported on 05/06/2018 04/25/18   Elpidio Anis, PA-C  potassium chloride SA (K-DUR,KLOR-CON) 20 MEQ tablet Take 1 tablet (20 mEq total) by mouth 2 (two) times daily for 7 days. Patient not taking: Reported on 04/25/2018 10/11/17 04/25/26  Barkley Boards, PA-C    Family History No family history on file.  Social History Social History   Tobacco Use  . Smoking status: Current Every Day Smoker    Packs/day: 0.50    Types: Cigarettes  . Smokeless tobacco: Never Used  Substance Use Topics  . Alcohol use: Yes  . Drug use: No     Allergies   Patient has no known allergies.   Review of Systems Review of Systems  All other systems reviewed and are negative.    Physical Exam Updated  Vital Signs BP (!) 131/92 (BP Location: Right Arm)   Pulse 81   Temp 98.6 F (37 C) (Oral)   Resp 16   SpO2 97%   Physical Exam Vitals signs and nursing note reviewed.  Constitutional:      Appearance: He is well-developed.  HENT:     Head: Normocephalic and atraumatic.  Eyes:     General: No scleral icterus.       Right eye: No discharge.        Left eye: No discharge.     Conjunctiva/sclera: Conjunctivae normal.     Pupils: Pupils are equal, round, and reactive to light.  Neck:     Musculoskeletal: Normal range of motion and neck supple.     Vascular: No JVD.  Cardiovascular:       Rate and Rhythm: Normal rate and regular rhythm.     Heart sounds: Normal heart sounds. No murmur. No friction rub. No gallop.   Pulmonary:     Effort: Pulmonary effort is normal. No respiratory distress.     Breath sounds: Normal breath sounds. No wheezing or rales.  Chest:     Chest wall: No tenderness.  Abdominal:     General: There is no distension.     Palpations: Abdomen is soft. There is no mass.     Tenderness: There is no abdominal tenderness. There is no guarding or rebound.  Musculoskeletal: Normal range of motion.        General: No tenderness.  Skin:    General: Skin is warm and dry.  Neurological:     Mental Status: He is alert and oriented to person, place, and time.  Psychiatric:        Behavior: Behavior normal.        Thought Content: Thought content normal.        Judgment: Judgment normal.      ED Treatments / Results  Labs (all labs ordered are listed, but only abnormal results are displayed) Labs Reviewed  ACETAMINOPHEN LEVEL - Abnormal; Notable for the following components:      Result Value   Acetaminophen (Tylenol), Serum <10 (*)    All other components within normal limits  CBC - Abnormal; Notable for the following components:   WBC 10.8 (*)    All other components within normal limits  RAPID URINE DRUG SCREEN, HOSP PERFORMED - Abnormal; Notable for the following components:   Tetrahydrocannabinol POSITIVE (*)    All other components within normal limits  COMPREHENSIVE METABOLIC PANEL  ETHANOL  SALICYLATE LEVEL    EKG None  Radiology No results found.  Procedures Procedures (including critical care time)  Medications Ordered in ED Medications - No data to display   Initial Impression / Assessment and Plan / ED Course  I have reviewed the triage vital signs and the nursing notes.  Pertinent labs & imaging results that were available during my care of the patient were reviewed by me and considered in my medical decision making  (see chart for details).     Patient with depression, anxiety, and suicidal thoughts.  Seen by TTS in triage, and recommended for a.m. psych evaluation.  Will move patient to [facility] and order home meds.  Final Clinical Impressions(s) / ED Diagnoses   Final diagnoses:  Suicidal thoughts    ED Discharge Orders    None       Roxy HorsemanBrowning, Julita Ozbun, PA-C 05/08/18 2256    Loren RacerYelverton, David, MD 05/10/18 1534

## 2018-05-08 NOTE — ED Notes (Signed)
Pt ambulatory from [facility] to [facility] room [facility]. Pt was requesting phone call to girlfriend at [facility]. Pt informed that it is past time for him to make a phone call tonight but that he could use the phone in the morning. Pt verbalizes understanding. Pt is calm and compliant at this time.

## 2018-05-08 NOTE — ED Notes (Signed)
Security notified to wand pt 

## 2018-05-08 NOTE — ED Notes (Signed)
Pt belongings are in locker #1 at [facility]
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
## 2018-05-08 NOTE — BHH Counselor (Signed)
Disposition: Randy Sievert, PA recommends discharge with OPT resources. Discussed with Dr. Pilar Plate and Randy Jungling, RN. Clinician spoke to pt and discussed his disposition. Pt continues to denies, SI, HI, AVH, self-injurious behaviors and access to weapons. Pt signed No Harm Contract. Clinician expressed to the pt to return to the hospital and/or call 911 if needed. Clinician provided OPT resources to the pt. Pt continues to contract for safety.   Redmond Pulling, MS, [facility], [facility] Triage Specialist [phone]

## 2018-05-08 NOTE — Discharge Instructions (Addendum)
You were evaluated in the Emergency Department and after careful evaluation, we did not find any emergent condition requiring admission or further testing in the hospital.  Please follow-up with your primary care provider to discuss your thyroid function.  Please return to the Emergency Department if you experience any worsening of your condition.  We encourage you to follow up with a primary care provider.  Thank you for allowing [place] to be a part of your care.

## 2018-05-08 NOTE — BH Assessment (Addendum)
Assessment Note  Randy Webster is an 43 y.o. male. The pt came in after having suicidal thoughts.  He stated he was sitting at a bus stop and pulled out a green box cutter.  He stated the box cutter was beside him and he kept looking at it and thinking about cutting. The pt didn't cut himself.  He stated he picked it up and threw it in the woods.  The pt denies ever cutting himself or attempting suicide in the past.  The pt now denies SI.  He went to [facility] earlier today and was prescribed Cymbalta.  He stated he didn't have money to buy the medication. He is requesting to start medication in the hospital, but doesn't want to be inpatient and he contracts for safety.  He stated he is stressed about not having a job, lack of finances, the 20th anniversary of his mother's death and his fiance being in the hospital.  He was at [facility] for passive SI.  The pt was at the hospital 05/06/2018 for a migraine.  The pt has one inpatient hospitalization and that was in 2015 in [place] for depression.  The pt lives with his fiance, but his fiance is in the hospital currently.  A previous note states the fiance is in [place].  The pt denies self harm, HI, legal issues, history of abuse and hallucinations.  He isn't sleeping well and has a poor appetite.  He reports feeling depressed and feeling about himself.  The pt reports using marijuana about 2 days ago.  He stated he normally doesn't smoke marijuana.  Pt is dressed in scrubs. He is alert and oriented x4. Pt speaks in a clear tone, at moderate volume and normal pace. Eye contact is good. Pt's mood is depressed. Thought process is coherent and relevant. There is no indication Pt is currently responding to internal stimuli or experiencing delusional thought content.?Pt was cooperative throughout assessment.    Diagnosis: F33.2 Major depressive disorder, recurrent severe without psychotic features   Past Medical History:  Past Medical History:  Diagnosis  Date  . Anxiety   . Depression   . Thyroid disease     History reviewed. No pertinent surgical history.  Family History: No family history on file.  Social History:  reports that he has been smoking cigarettes. He has been smoking about 0.50 packs per day. He has never used smokeless tobacco. He reports current alcohol use. He reports that he does not use drugs.  Additional Social History:  Alcohol / Drug Use Pain Medications: See MAR Prescriptions: See MAR Over the Counter: See MAR History of alcohol / drug use?: Yes Longest period of sobriety (when/how long): unknown Substance #1 Name of Substance 1: marijuana 1 - Age of First Use: unknown 1 - Amount (size/oz): one joint 1 - Frequency: "rarely" 1 - Last Use / Amount: 05/06/2018  CIWA: CIWA-Ar BP: (!) 131/92 Pulse Rate: 81 COWS:    Allergies: No Known Allergies  Home Medications: (Not in a hospital admission)   OB/GYN Status:  No LMP for male patient.  General Assessment Data Location of Assessment: [facility] ED TTS Assessment: In system Is this a Tele or Face-to-Face Assessment?: Face-to-Face Is this an Initial Assessment or a Re-assessment for this encounter?: Initial Assessment Patient Accompanied by:: N/A Language Other than English: No Living Arrangements: Other (Comment)(home) What gender do you identify as?: Male Marital status: Single Living Arrangements: Non-relatives/Friends Can pt return to current living arrangement?: Yes Admission Status: Voluntary Is  patient capable of signing voluntary admission?: Yes Referral Source: Self/Family/Friend Insurance type: Self Pay     Crisis Care Plan Living Arrangements: Non-relatives/Friends Legal Guardian: Other:(Self) Name of Psychiatrist: [facility] Name of Therapist: [facility]  Education Status Is patient currently in school?: No Is the patient employed, unemployed or receiving disability?: Unemployed  Risk to self with the past 6 months Suicidal Ideation:  No-Not Currently/Within Last 6 Months Has patient been a risk to self within the past 6 months prior to admission? : No Suicidal Intent: No Has patient had any suicidal intent within the past 6 months prior to admission? : No Is patient at risk for suicide?: Yes Suicidal Plan?: No-Not Currently/Within Last 6 Months Has patient had any suicidal plan within the past 6 months prior to admission? : Yes Access to Means: Yes Specify Access to Suicidal Means: had a box cutter What has been your use of drugs/alcohol within the last 12 months?: occasional marijuana use Previous Attempts/Gestures: Yes How many times?: 1 Other Self Harm Risks: pt denies Triggers for Past Attempts: None known Intentional Self Injurious Behavior: None Family Suicide History: No Recent stressful life event(s): Financial Problems, Recent negative physical changes, Job Loss Persecutory voices/beliefs?: No Depression: Yes Depression Symptoms: Despondent, Insomnia, Feeling worthless/self pity Substance abuse history and/or treatment for substance abuse?: No Suicide prevention information given to non-admitted patients: Yes  Risk to Others within the past 6 months Homicidal Ideation: No Does patient have any lifetime risk of violence toward others beyond the six months prior to admission? : No Thoughts of Harm to Others: No Current Homicidal Intent: No Current Homicidal Plan: No Access to Homicidal Means: No Identified Victim: Pt denies History of harm to others?: No Assessment of Violence: None Noted Violent Behavior Description: none Does patient have access to weapons?: No Criminal Charges Pending?: No Does patient have a court date: No Is patient on probation?: No  Psychosis Hallucinations: None noted Delusions: None noted  Mental Status Report Appearance/Hygiene: In scrubs, Disheveled Eye Contact: Good Motor Activity: Freedom of movement Speech: Logical/coherent Level of Consciousness: Alert Mood:  Depressed Affect: Depressed Anxiety Level: None Thought Processes: Coherent, Relevant Judgement: Impaired Orientation: Person, Place, Time, Situation Obsessive Compulsive Thoughts/Behaviors: None  Cognitive Functioning Concentration: Normal Memory: Recent Intact, Remote Intact Is patient IDD: No Insight: Fair Impulse Control: Fair Appetite: Poor Have you had any weight changes? : No Change Sleep: Decreased Total Hours of Sleep: 3 Vegetative Symptoms: None  ADLScreening [facility] Assessment Services) Patient's cognitive ability adequate to safely complete daily activities?: Yes Patient able to express need for assistance with ADLs?: Yes Independently performs ADLs?: Yes (appropriate for developmental age)  Prior Inpatient Therapy Prior Inpatient Therapy: Yes Prior Therapy Dates: 2015. [facility] [place] Reason for Treatment: SI.  Prior Outpatient Therapy Prior Outpatient Therapy: Yes Prior Therapy Dates: current [facility] Reason for Treatment: depression Does patient have an ACCT team?: No Does patient have Intensive In-House Services?  : No Does patient have [facility] services? : Yes Does patient have P4CC services?: No  ADL Screening (condition at time of admission) Patient's cognitive ability adequate to safely complete daily activities?: Yes Patient able to express need for assistance with ADLs?: Yes Independently performs ADLs?: Yes (appropriate for developmental age)       Abuse/Neglect Assessment (Assessment to be complete while patient is alone) Abuse/Neglect Assessment Can Be Completed: Yes Physical Abuse: Denies Verbal Abuse: Denies Sexual Abuse: Denies Exploitation of patient/patient's resources: Denies Self-Neglect: Denies Values / Beliefs  Cultural Requests During Hospitalization: None Spiritual Requests During Hospitalization: None Consults Spiritual Care Consult Needed:  No Social Work Consult Needed: No Merchant navy officer (For Healthcare) Does Patient Have a Medical Advance Directive?: No Would patient like information on creating a medical advance directive?: No - Patient declined          Disposition:  Disposition Initial Assessment Completed for this Encounter: Yes   PA Donell Sievert recommends the pt be observed overnight for safety and stabilization.  The pt is to be reassessed by psychiatry in the morning.   On Site Evaluation by:   Reviewed with Physician:    Ottis Stain 05/08/2018 10:28 PM

## 2018-05-08 NOTE — ED Notes (Signed)
Pt seen and wand by security.  Pt's belongings at triage desk.

## 2018-05-08 NOTE — ED Triage Notes (Addendum)
Pt states he was seen at [facility] ED last night for depression.  Went to [facility] today and got Rx for Cymbalta but couldn't afford it.  States he is suicidal and was sitting on a bench and had his razor blade beside him thinking about cutting himself.  States he decided to come to hospital to be seen.

## 2018-05-08 NOTE — ED Provider Notes (Signed)
  Provider Note MRN:  [id number]  Arrival date & time: 05/08/18    ED Course and Medical Decision Making  I received sign out for this patient at shift change from Dr. Rubin PayorPickering.  TSH elevated but near his prior levels, dosed here with levothyroxine.  TTS recommends outpatient management and discharge.  Patient advised to follow-up with his PCP.    After the discussed management above, the patient was determined to be safe for discharge.  The patient was in agreement with this plan and all questions regarding their care were answered.  ED return precautions were discussed and the patient will return to the ED with any significant worsening of condition.   Elmer SowMichael M. Pilar PlateBero, MD [facility] [email or web address]    Sabas SousBero, Sircharles Holzheimer M, MD 05/08/18 [phone]

## 2018-05-08 NOTE — ED Provider Notes (Deleted)
10:08 PM Received report from TTS, recommended for AM psych eval.   Roxy Horseman, PA-C 05/08/18 [id number]

## 2018-05-09 ENCOUNTER — Inpatient Hospital Stay (HOSPITAL_COMMUNITY)
Admission: AD | Admit: 2018-05-09 | Discharge: 2018-05-13 | DRG: 885 | Disposition: A | Discharge status: Home / Self Care | Payer: Federal, State, Local not specified - Other | Source: Intra-hospital | Attending: Psychiatry | Admitting: Psychiatry

## 2018-05-09 ENCOUNTER — Other Ambulatory Visit: Payer: Self-pay | Admitting: Registered Nurse

## 2018-05-09 ENCOUNTER — Encounter (HOSPITAL_COMMUNITY): Payer: Self-pay | Admitting: Registered Nurse

## 2018-05-09 DIAGNOSIS — E039 Hypothyroidism, unspecified: Secondary | ICD-10-CM | POA: Diagnosis present

## 2018-05-09 DIAGNOSIS — Z79890 Hormone replacement therapy (postmenopausal): Secondary | ICD-10-CM | POA: Diagnosis not present

## 2018-05-09 DIAGNOSIS — R45851 Suicidal ideations: Secondary | ICD-10-CM | POA: Diagnosis present

## 2018-05-09 DIAGNOSIS — F419 Anxiety disorder, unspecified: Secondary | ICD-10-CM | POA: Diagnosis present

## 2018-05-09 DIAGNOSIS — F332 Major depressive disorder, recurrent severe without psychotic features: Principal | ICD-10-CM | POA: Diagnosis present

## 2018-05-09 MED ORDER — LEVOTHYROXINE SODIUM 125 MCG PO TABS
125.0000 ug | ORAL_TABLET | Freq: Every day | ORAL | Status: DC
  Administered 2018-05-10 – 2018-05-13 (×4): 125 ug via ORAL
  Filled 2018-05-09 (×6): qty 1

## 2018-05-09 MED ORDER — TRAZODONE HCL 50 MG PO TABS
50.0000 mg | ORAL_TABLET | Freq: Every evening | ORAL | Status: DC | PRN
  Administered 2018-05-10: 50 mg via ORAL
  Filled 2018-05-09 (×5): qty 1

## 2018-05-09 MED ORDER — HYDROXYZINE HCL 25 MG PO TABS
25.0000 mg | ORAL_TABLET | Freq: Three times a day (TID) | ORAL | Status: DC | PRN
  Administered 2018-05-12: 25 mg via ORAL
  Filled 2018-05-09: qty 1
  Filled 2018-05-09: qty 10

## 2018-05-09 NOTE — ED Notes (Signed)
[facility] called for transport. State they will be about 20 minutes. Pt aware.

## 2018-05-09 NOTE — ED Notes (Signed)
Pt on phone at nurses' desk talking w/significant other - advising of tx plan - accepted to [facility].

## 2018-05-09 NOTE — ED Notes (Signed)
Pt voiced understanding and agreement w/tx plan - accepted to [facility] - copy faxed to [facility], copy sent to Medical Records, and original placed in envelope for [facility].

## 2018-05-09 NOTE — ED Notes (Signed)
[facility] here to transport pt  

## 2018-05-09 NOTE — ED Notes (Signed)
Sitting up in bed watching tv. Pt ate lunch.

## 2018-05-09 NOTE — ED Notes (Signed)
Pt on phone at nurses' desk. 
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
## 2018-05-09 NOTE — ED Notes (Signed)
Telepsych completed.  

## 2018-05-09 NOTE — Progress Notes (Signed)
Per Randy Webster, Randy Webster, pt has been accepted to [facility] bed 405-1. Accepting provider is Randy FoundShuvon Rankin, Randy Webster. Attending provider is Dr. [name], MD. Patient can arrive by 21:30. Number for report is [phone]. CSW spoke with Kriste BasqueBecky, RN regarding disposition.   Vilma MeckelEarl R. Algis GreenhouseForbes, MSW, LCSW Clinical Social Work/Disposition Phone: [phone] Fax: [phone]

## 2018-05-10 ENCOUNTER — Other Ambulatory Visit: Payer: Self-pay

## 2018-05-10 ENCOUNTER — Encounter (HOSPITAL_COMMUNITY): Payer: Self-pay

## 2018-05-10 DIAGNOSIS — F332 Major depressive disorder, recurrent severe without psychotic features: Principal | ICD-10-CM

## 2018-05-10 MED ORDER — TRAZODONE HCL 50 MG PO TABS
50.0000 mg | ORAL_TABLET | Freq: Every evening | ORAL | Status: DC | PRN
  Administered 2018-05-10 – 2018-05-12 (×3): 50 mg via ORAL
  Filled 2018-05-10: qty 1
  Filled 2018-05-10: qty 7
  Filled 2018-05-10 (×2): qty 1

## 2018-05-10 MED ORDER — SERTRALINE HCL 50 MG PO TABS
50.0000 mg | ORAL_TABLET | Freq: Every day | ORAL | Status: DC
  Administered 2018-05-10 – 2018-05-13 (×4): 50 mg via ORAL
  Filled 2018-05-10 (×5): qty 1

## 2018-05-10 NOTE — [facility] Group Notes (Signed)
[facility] LCSW Group Therapy Note  05/10/2018   10:00-11:00AM  Type of Therapy and Topic:  Group Therapy:  Unhealthy versus Healthy Supports, Which Am I?  Participation Level:  Active   Description of Group:  Patients in this group were introduced to the concept that additional supports including self-support are an essential part of recovery.  Initially a discussion was held about the differences between healthy versus unhealthy supports.  Patients were asked to share what unhealthy supports in their lives need to be addressed, as well as what additional healthy supports could be added for greater help in reaching their goals.   A song entitled "My Own Hero" was played and a group discussion ensued in which patients stated they could relate to the song and it inspired them to realize they have be willing to help themselves in order to succeed, because other people cannot achieve sobriety or stability for them.  We discussed adding a variety of healthy supports to address the various needs in patient lives, including becoming more self-supportive.  Therapeutic Goals: 1)  Highlight the differences between healthy and unhealthy supports 2)  Suggest the importance of being a part of one's own support system 2)  Discuss reasons people in one's life may eventually be unable to be continually supportive  3)  Identify the patient's current support system and   4)  elicit commitments to add healthy supports and to become more conscious of being self-supportive   Summary of Patient Progress:  The patient expressed that the unhealthy support which needs to be addressed includes sister to who he has not spoken for 9 years.  Healthy supports which help with  increased stability and happiness include his fiancee whom he has known for 31 years.  Therapeutic Modalities:   Motivational Interviewing Activity  Lynnell Chad

## 2018-05-10 NOTE — Progress Notes (Signed)
Patient is voluntary from [facility] where he went after contemplating suicide with a box cutter. 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 He denies SI during admission assessment with this RN and contracts for safety. Patient denies HI and AVH. Identified stressors include anniversary of mother's death (will be 20 years ago in march), worrying about girlfriend who is suicidal, and money problems. He reports his support system is his girlfriend and he doesn't have any other family. Patient was seen at [facility] Friday February 7th and prescribed Cymbalta and trazodone which he has not filled because he can't afford them. Past medical history is significant for thyroid disease, taking synthroid. No drug or food allergies. Patient reports he was on crutches recently for a series of pulled muscles in both legs but reports no falls and feeling steady on his feet. Denies drug and alcohol use. Low fall risk implemented. Patient reports smoking 0.5 pack/day of cigarettes but declined patch/gum. Skin assessment significant for scab on left arm antecubital and bruise on right arm antecubital. Oriented to the unit and given food, drink, and sleep medication. Patient understands his rights and responsibilities.

## 2018-05-10 NOTE — Tx Team (Signed)
Initial Treatment Plan 05/10/2018 2:02 AM [name] SEG:[id number]    PATIENT STRESSORS: Financial difficulties Other: aniversary of mother's death (died 20 years ago 07/09/22)   PATIENT STRENGTHS: Wellsite geologist fund of knowledge Physical Health   PATIENT IDENTIFIED PROBLEMS: "depression" "anxiety" "suicidal"                     DISCHARGE CRITERIA:  Improved stabilization in mood, thinking, and/or behavior Need for constant or close observation no longer present  PRELIMINARY DISCHARGE PLAN: Return to previous living arrangement  PATIENT/FAMILY INVOLVEMENT: This treatment plan has been presented to and reviewed with the patient, Randy Webster.  The patient and family have been given the opportunity to ask questions and make suggestions.  Edwyna Perfect, RN 05/10/2018, 2:02 AM

## 2018-05-10 NOTE — [facility] Counselor (Signed)
Adult Comprehensive Assessment  Patient ID: Randy Webster, male   DOB: [birth year], 43 y.o.   MRN: [id number]  Information Source: Information source: Patient  Current Stressors:  Patient states their primary concerns and needs for treatment are:: Depression and anxiety Patient states their goals for this hospitilization and ongoing recovery are:: Regulate depression/anxiety to where he can live on a daily basis. Educational / Learning stressors: Denies stressors Employment / Job issues: Has no job currently for the last 6 months. Family Relationships: Does not know who father is, mother is deceased, does not want aunt to know what is going on, and has not spoken to sister in 9 years. Financial / Lack of resources (include bankruptcy): "Off the charts" - very strained due to lack of employment.  Have spent all their savings. Housing / Lack of housing: Lack of funds is compromising their housing, although they do not yet have an eviction notice. Physical health (include injuries & life threatening diseases): hHs recently gotten off crutches.  Legs still tender because of how long he was on them.    Does not know what was wrong with except for severely strained muscles. Social relationships: Denies stressors - has no outside relationships except for fiancee. Substance abuse: Denies stressors Bereavement / Loss: Mother died 20 years ago.  Living/Environment/Situation:  Living Arrangements: Non-relatives/Friends Living conditions (as described by patient or guardian): Fair Who else lives in the home?: [place] How long has patient lived in current situation?: 8-9 months What is atmosphere in current home: Comfortable, Paramedic, Supportive  Family History:  Marital status: Long term relationship Long term relationship, how long?: 31 years, off and on, 10 years this time What types of issues is patient dealing with in the relationship?: Steffanie Rainwater is at [place], will get out tomorrow.  Is there because of  suicidal ideation and a need for medication adjustments. Additional relationship information: Was married twice in between this relationship, divorced. Are you sexually active?: Yes What is your sexual orientation?: Heterosexual Does patient have children?: No  Childhood History:  By whom was/is the patient raised?: Mother Additional childhood history information: Last time he saw father was when he was 7yo. Description of patient's relationship with caregiver when they were a child: Mother - perfect relationship Patient's description of current relationship with people who raised him/her: Mother - deceased; Father - no contact since he had to repossess his father's car when he was 29yo. How were you disciplined when you got in trouble as a child/adolescent?: Time out Does patient have siblings?: Yes Number of Siblings: 1 Description of patient's current relationship with siblings: sister - no contact in 9 years. Did patient suffer any verbal/emotional/physical/sexual abuse as a child?: No Did patient suffer from severe childhood neglect?: No Has patient ever been sexually abused/assaulted/raped as an adolescent or adult?: No Was the patient ever a victim of a crime or a disaster?: No Witnessed domestic violence?: No Has patient been effected by domestic violence as an adult?: No  Education:  Highest grade of school patient has completed: 9th grade, GED Currently a student?: No Learning disability?: Yes What learning problems does patient have?: Attention deficit  Employment/Work Situation:   Employment situation: Unemployed What is the longest time patient has a held a job?: 2-1/2 years 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  Did smoke 2-3 days ago.  Drinks once every 6 months. Alcohol/Substance Abuse Treatment Hx: Denies past history Has alcohol/substance abuse ever caused legal problems?: No  Social Support System:   Patient's Community Support System: Good Describe Community Support System: Fiancee Type of faith/religion: None How does patient's faith help to cope with current illness?: N/A  Leisure/Recreation:   Leisure and Hobbies: No fun in a long time.  Strengths/Needs:   What is the patient's perception of their strengths?: Outgoing, look to the future, positive Patient states they can use these personal strengths during their treatment to contribute to their recovery: Try to think less about the negative and more about the positive Patient states these barriers may affect/interfere with their treatment: None Patient states these barriers may affect their return to the community: None Other important information patient would like considered in planning for their treatment: None  Discharge Plan:   Currently receiving community mental health services: Yes (From Whom)([facility] Patient states concerns and preferences for aftercare planning are: Has an appointment for therapy on 05/29/18 for first visit, and is supposed to go back to see medication manager in about 2-3 weeks.  [facility] Patient states they will know when they are safe and ready for discharge when: "When I'm out of this funk." Does patient have access to transportation?: No Does patient have financial barriers related to discharge medications?: Yes Patient description of barriers related to discharge medications: No income, no insurance Plan for no access to transportation at discharge: Will need a bus pass and instructions. Will patient be  returning to same living situation after discharge?: Yes  Summary/Recommendations:   Summary and Recommendations (to be completed by the evaluator): Patient is a 43yo male admitted voluntarily with suicidal thoughts of cutting himself with a box cutter.  He has been experiencing depression and anxiety, [facility] but could not [facility].  Primary stressors include unemployment for 6 months, strained finances while trying to live on only fiancee's disability income, physical problems which resulted in him being on crutches until recently, 20th anniversary of mother's death, and fiance being in [facility].  He smoked marijuana 2-3 days prior to admission but reported he normally does not smoke much, drinks rarely.  Patient will benefit from crisis stabilization, medication evaluation, group therapy and psychoeducation, in addition to case management for discharge planning. At discharge it is recommended that Patient adhere to the established discharge plan and continue in treatment.  Lynnell ChadMareida J Grossman-Orr. 05/10/2018

## 2018-05-10 NOTE — H&P (Signed)
Psychiatric Admission Assessment Adult  Patient Identification: Randy Webster MRN:  [id number] Date of Evaluation:  05/10/2018 Chief Complaint:  "I have major depression and a lot of anxiety" Principal Diagnosis: MDD, no psychotic features  Diagnosis:  Active Problems:   MDD (major depressive disorder), recurrent episode, severe (HCC)  History of Present Illness: 43 year old male, presented to ED for depression. Reports he was discharged after a period of observation but again felt more depressed and returned to ED the next day, endorsing worsening depression, passive SI, and neuro vegetative symptoms as below.  States he has been feeling significantly depressed and anxious over recent weeks. Attributes symptoms in part to significant stressors- he states that up to recently he had been using crutches for several weeks, unable to walk much or work, financial difficulties, and upcoming anniversary of mother's death. Reported intermittent passive SI, without plan or intention, but states he did recently have thoughts of cutting wrists with a box cutter, which led him to throw box cutter away in the woods , so as to not have access to it . Endorses neuro-vegetative symptoms, denies psychotic symptoms  Associated Signs/Symptoms: Depression Symptoms:  depressed mood, anhedonia, insomnia, suicidal thoughts without plan, anxiety, loss of energy/fatigue, (Hypo) Manic Symptoms:  None noted or endorsed  Anxiety Symptoms:  Reports increased anxiety recently, states he worries excessively and sometimes has panic attacks Psychotic Symptoms:  Denies  PTSD Symptoms: Denies  Total Time spent with patient:  45 minutes   Past Psychiatric History: one prior psychiatric admission in 2013 for anxiety, depression.  Reports history of suicidal ideations, denies attempts. Denies history of self cutting . Denies history of psychosis.  Reports history of depression, which started after his mother died when he was in  his early 19s. States depression has been intermittent. Does not endorse any clear history of hypomania or mania. States he has not been on psychiatric medications in the past. Reports history of panic attacks and a tendency to worry excessively. Denies agoraphobia. Denies history of violence .  Is the patient at risk to self? Yes.    Has the patient been a risk to self in the past 6 months? Yes.    Has the patient been a risk to self within the distant past? Yes.    Is the patient a risk to others? No.  Has the patient been a risk to others in the past 6 months? No.  Has the patient been a risk to others within the distant past? No.   Prior Inpatient Therapy:  as above  Prior Outpatient Therapy:  he had been following up at [facility]  Alcohol Screening: 1. How often do you have a drink containing alcohol?: Never 2. How many drinks containing alcohol do you have on a typical day when you are drinking?: 1 or 2 3. How often do you have six or more drinks on one occasion?: Never AUDIT-C Score: 0 9. Have you or someone else been injured as a result of your drinking?: No 10. Has a relative or friend or a doctor or another health worker been concerned about your drinking or suggested you cut down?: No Alcohol Use Disorder Identification Test Final Score (AUDIT): 0 Alcohol Brief Interventions/Follow-up: AUDIT Score <7 follow-up not indicated Substance Abuse History in the last 12 months:  Reports remote history of alcohol abuse, but states stopped drinking heavily years ago and now drinks rarely. Reports intermittent cannabis abuse . Denies other drug abuse . Consequences of Substance Abuse: Denies  Previous Psychotropic Medications: states he has not been on psychiatric medications in the past.  Psychological Evaluations:  No  Past Medical History: reports history of hypothyroidism, NKDA.  Past Medical History:  Diagnosis Date  . Anxiety   . Depression   . Thyroid disease    History  reviewed. No pertinent surgical history. Family History: states he has had no contact with father since he was a child, mother died 20 years ago from CVA. Has one sister. Family Psychiatric  History: No known history of mental illness in family, grandfather and mother had history of alcohol abuse. No known suicides in family. Tobacco Screening: Smokes 1/2 PPD Social History: 42, single, lives with GF, no children, currently unemployed .  Social History   Substance and Sexual Activity  Alcohol Use Not Currently     Social History   Substance and Sexual Activity  Drug Use No    Additional Social History:  Allergies:  No Known Allergies Lab Results:  Results for orders placed or performed during the hospital encounter of 05/08/18 (from the past 48 hour(s))  Rapid urine drug screen (hospital performed)     Status: Abnormal   Collection Time: 05/08/18  9:14 PM  Result Value Ref Range   Opiates NONE DETECTED NONE DETECTED   Cocaine NONE DETECTED NONE DETECTED   Benzodiazepines NONE DETECTED NONE DETECTED   Amphetamines NONE DETECTED NONE DETECTED   Tetrahydrocannabinol POSITIVE (A) NONE DETECTED   Barbiturates NONE DETECTED NONE DETECTED    Comment: (NOTE) DRUG SCREEN FOR MEDICAL PURPOSES ONLY.  IF CONFIRMATION IS NEEDED FOR ANY PURPOSE, NOTIFY LAB WITHIN 5 DAYS. LOWEST DETECTABLE LIMITS FOR URINE DRUG SCREEN Drug Class                     Cutoff (ng/mL) Amphetamine and metabolites    1000 Barbiturate and metabolites    200 Benzodiazepine                 200 Tricyclics and metabolites     300 Opiates and metabolites        300 Cocaine and metabolites        300 THC                            50 Performed at [facility], [address], [place], [place] [address]   Comprehensive metabolic panel     Status: None   Collection Time: 05/08/18  9:17 PM  Result Value Ref Range   Sodium 140 135 - 145 mmol/L   Potassium 4.1 3.5 - 5.1 mmol/L   Chloride 104 98 - 111 mmol/L    CO2 24 22 - 32 mmol/L   Glucose, Bld 96 70 - 99 mg/dL   BUN 13 6 - 20 mg/dL   Creatinine, Ser 1.91 0.61 - 1.24 mg/dL   Calcium 9.5 8.9 - 47.8 mg/dL   Total Protein 6.6 6.5 - 8.1 g/dL   Albumin 3.7 3.5 - 5.0 g/dL   AST 19 15 - 41 U/L   ALT 14 0 - 44 U/L   Alkaline Phosphatase 53 38 - 126 U/L   Total Bilirubin 0.3 0.3 - 1.2 mg/dL   GFR calc non Af Amer >60 >60 mL/min   GFR calc Af Amer >60 >60 mL/min   Anion gap 12 5 - 15    Comment: Performed at [facility], [address] [place], [place] [address]  Ethanol     Status: None   Collection Time: 05/08/18  9:17 PM  Result Value Ref Range   Alcohol, Ethyl (B) <10 <10 mg/dL    Comment: (NOTE) Lowest detectable limit for serum alcohol is 10 mg/dL. For medical purposes only. Performed at [facility], [address] [place], [place] [address]   Salicylate level     Status: None   Collection Time: 05/08/18  9:17 PM  Result Value Ref Range   Salicylate Lvl <7.0 2.8 - 30.0 mg/dL    Comment: Performed at [facility], [address] [place], [place] [address]  Acetaminophen level     Status: Abnormal   Collection Time: 05/08/18  9:17 PM  Result Value Ref Range   Acetaminophen (Tylenol), Serum <10 (L) 10 - 30 ug/mL    Comment: (NOTE) Therapeutic concentrations vary significantly. A range of 10-30 ug/mL  may be an effective concentration for many patients. However, some  are best treated at concentrations outside of this range. Acetaminophen concentrations >150 ug/mL at 4 hours after ingestion  and >50 ug/mL at 12 hours after ingestion are often associated with  toxic reactions. Performed at [facility], [address] [place], [place] [address]   cbc     Status: Abnormal   Collection Time: 05/08/18  9:17 PM  Result Value Ref Range   WBC 10.8 (H) 4.0 - 10.5 K/uL   RBC 4.65 4.22 - 5.81 MIL/uL   Hemoglobin 15.1 13.0 - 17.0 g/dL   HCT 91.445.6 78.239.0 - 95.652.0 %   MCV 98.1 80.0 - 100.0 fL   MCH 32.5 26.0 -  34.0 pg   MCHC 33.1 30.0 - 36.0 g/dL   RDW 21.314.5 08.611.5 - 57.815.5 %   Platelets 319 150 - 400 K/uL   nRBC 0.0 0.0 - 0.2 %    Comment: Performed at [facility], [address] [place], [place] [address]    Blood Alcohol level:  Lab Results  Component Value Date   ETH <10 05/08/2018   ETH <10 05/07/2018    Metabolic Disorder Labs:  No results found for: HGBA1C, MPG No results found for: PROLACTIN No results found for: CHOL, TRIG, HDL, CHOLHDL, VLDL, LDLCALC  Current Medications: Current Facility-Administered Medications  Medication Dose Route Frequency Provider Last Rate Last Dose  . hydrOXYzine (ATARAX/VISTARIL) tablet 25 mg  25 mg Oral TID PRN Jackelyn PolingBerry, Jason A, NP      . levothyroxine (SYNTHROID, LEVOTHROID) tablet 125 mcg  125 mcg Oral QAC breakfast Nira ConnBerry, Jason A, NP   125 mcg at 05/10/18 0612  . traZODone (DESYREL) tablet 50 mg  50 mg Oral QHS,MR X 1 Nira ConnBerry, Jason A, NP   50 mg at 05/10/18 0013   PTA Medications: Medications Prior to Admission  Medication Sig Dispense Refill Last Dose  . cyclobenzaprine (FLEXERIL) 5 MG tablet Take 1 tablet (5 mg total) by mouth 2 (two) times daily as needed for muscle spasms. 20 tablet 0 not started  . furosemide (LASIX) 20 MG tablet Take 1 tablet (20 mg total) by mouth daily. (Patient not taking: Reported on 04/25/2018) 7 tablet 0 Not Taking at Unknown time  . levothyroxine (SYNTHROID, LEVOTHROID) 125 MCG tablet Take 125 mcg by mouth daily before breakfast.   05/08/2018 at Unknown time  . levothyroxine (SYNTHROID, LEVOTHROID) 50 MCG tablet Take 1 tablet (50 mcg total) by mouth daily before breakfast. (Patient not taking: Reported on 04/25/2018) 30 tablet 0 Not Taking at  Unknown time  . meclizine (ANTIVERT) 25 MG tablet Take 1 tablet (25 mg total) by mouth 3 (three) times daily as needed for up to 20 doses for dizziness. (Patient not taking: Reported on 04/25/2018) 20 tablet 0 Completed Course at Unknown time  . metoCLOPramide (REGLAN) 10 MG tablet  Take 1 tablet (10 mg total) by mouth every 6 (six) hours. (Patient not taking: Reported on 05/06/2018) 15 tablet 0 Completed Course at Unknown time  . potassium chloride SA (K-DUR,KLOR-CON) 20 MEQ tablet Take 1 tablet (20 mEq total) by mouth 2 (two) times daily for 7 days. (Patient not taking: Reported on 04/25/2018) 14 tablet 0 Completed Course at Unknown time    Musculoskeletal: Strength & Muscle Tone: within normal limits Gait & Station: normal Patient leans: N/A  Psychiatric Specialty Exam: Physical Exam  Review of Systems  Constitutional: Negative.   HENT: Negative.   Eyes: Negative.   Respiratory: Negative.   Cardiovascular: Negative.   Gastrointestinal: Negative for diarrhea, nausea and vomiting.  Genitourinary: Negative.   Musculoskeletal: Negative.   Skin: Negative.   Neurological: Negative for seizures and headaches.  Endo/Heme/Allergies: Negative.   Psychiatric/Behavioral: Positive for depression and suicidal ideas. The patient is nervous/anxious.   All other systems reviewed and are negative.   Blood pressure 119/81, pulse 80, temperature 97.8 F (36.6 C), temperature source Oral, resp. rate 16, height 5' 10.47" (1.79 m), weight 87.8 kg.Body mass index is 27.39 kg/m.  General Appearance: Fairly Groomed  Eye Contact:  Fair  Speech:  Normal Rate  Volume:  Decreased  Mood:  reports feeling depressed, but states feels better than on admission  Affect:  constricted, anxious   Thought Process:  Linear and Descriptions of Associations: Intact  Orientation:  Full (Time, Place, and Person)  Thought Content:  denies hallucinations, no delusions , not internally preoccupied  Suicidal Thoughts:  No denies current suicidal ideations and contracts for safety on unit , denies homicidal or violent ideations  Homicidal Thoughts:  No  Memory:  recent and remote grossly intact   Judgement:  Fair  Insight:  Fair  Psychomotor Activity:  Normal  Concentration:  Concentration: Good and  Attention Span: Good  Recall:  Good  Fund of Knowledge:  Good  Language:  Good  Akathisia:  Negative  Handed:  Right  AIMS (if indicated):     Assets:  Communication Skills Desire for Improvement Resilience  ADL's:  Intact  Cognition:  WNL  Sleep:  Number of Hours: 4.5    Treatment Plan Summary: Daily contact with patient to assess and evaluate symptoms and progress in treatment, Medication management, Plan inpatient treatment  and medications as below  Observation Level/Precautions:  15 minute checks  Laboratory:  as needed   Psychotherapy:  Milieu, group therapy  Medications:  Continue Synthroid 125 micrograms for hypothyroidism Agrees to antidepressant trial. Start Zoloft 50 mgrs QDAY .  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 ( Hypothyroidism)  Long Term Goal(s): Improvement in symptoms so as ready for discharge  Short Term Goals: Ability to identify changes in lifestyle to reduce recurrence of condition will improve, Ability to verbalize feelings will improve, Ability to disclose and discuss suicidal ideas, Ability to demonstrate self-control will improve, Ability to identify and develop effective coping behaviors will improve and  Ability to maintain clinical measurements within normal limits will improve  Physician Treatment Plan for Secondary Diagnosis: Active Problems:   MDD (major depressive disorder), recurrent episode, severe (HCC)  Long Term Goal(s): Improvement in symptoms so as ready for discharge  Short Term Goals: Ability to identify changes in lifestyle to reduce recurrence of condition will improve, Ability to verbalize feelings will improve, Ability to disclose and discuss suicidal ideas, Ability to demonstrate self-control will improve, Ability to identify and  develop effective coping behaviors will improve and Ability to maintain clinical measurements within normal limits will improve  I certify that inpatient services furnished can reasonably be expected to improve the patient's condition.    Craige Cotta, MD 2/9/20201:00 PM

## 2018-05-10 NOTE — Progress Notes (Signed)
D. Pt presents with an anxious affect, calm and cooperative behavior. Pt voices no complaints- observed attending groups . Pt currently denies SI/HI and AVH and agrees to contact staff before acting on any harmful thoughts.  A. Labs and vitals monitored. Pt compliant with medications. Pt supported emotionally and encouraged to express concerns and ask questions.   R. Pt remains safe with 15 minute checks. Will continue POC.

## 2018-05-10 NOTE — Progress Notes (Signed)
Adult Psychoeducational Group Note  Date:  05/10/2018 Time:  8:59 PM  Group Topic/Focus:  Wrap-Up Group:   The focus of this group is to help patients review their daily goal of treatment and discuss progress on daily workbooks.  Participation Level:  Active  Participation Quality:  Appropriate  Affect:  Appropriate  Cognitive:  Appropriate  Insight: Appropriate  Engagement in Group:  Engaged  Modes of Intervention:  Discussion  Additional Comments:  Patient attended group and participated.  Ixel Boehning W Donja Tipping 05/10/2018, 8:59 PM

## 2018-05-10 NOTE — [facility] Suicide Risk Assessment (Signed)
[facility] Admission Suicide Risk Assessment   Nursing information obtained from:    Demographic factors:  Male, Caucasian, Low socioeconomic status, Unemployed Current Mental Status:  Suicidal ideation indicated by patient, Suicide plan, Plan includes specific time, place, or method Loss Factors:  Financial problems / change in socioeconomic status Historical Factors:  NA Risk Reduction Factors:  Living with another person, especially a relative  Total Time spent with patient: 45 minutes Principal Problem: MDD Diagnosis:  Active Problems:   MDD (major depressive disorder), recurrent episode, severe (HCC)  Subjective Data:   Continued Clinical Symptoms:  Alcohol Use Disorder Identification Test Final Score (AUDIT): 0 The "Alcohol Use Disorders Identification Test", Guidelines for Use in Primary Care, Second Edition.  World Science writer [facility]). Score between 0-7:  no or low risk or alcohol related problems. Score between 8-15:  moderate risk of alcohol related problems. Score between 16-19:  high risk of alcohol related problems. Score 20 or above:  warrants further diagnostic evaluation for alcohol dependence and treatment.   CLINICAL FACTORS:  43 year old male, presented to ED due to depression, neurovegetative symptoms, passive SI And  more recent thoughts of cutting wrists with a box cutter .  No psychotic symptoms. Reports prior history of depression and states he has had intermittent episodes of depression since his mother died when he was in his early 89s.  In addition to depression also endorses increased anxiety, some panic attacks.  Not taking any psychiatric medications prior to admission.  Medical history remarkable for hypothyroidism   Psychiatric Specialty Exam: Physical Exam  ROS  Blood pressure 119/81, pulse 80, temperature 97.8 F (36.6 C), temperature source Oral, resp. rate 16, height 5' 10.47" (1.79 m), weight 87.8 kg.Body mass index is 27.39 kg/m.  See admit  note MSE                                                        COGNITIVE FEATURES THAT CONTRIBUTE TO RISK:  Closed-mindedness and Loss of executive function    SUICIDE RISK:   Moderate:  Frequent suicidal ideation with limited intensity, and duration, some specificity in terms of plans, no associated intent, good self-control, limited dysphoria/symptomatology, some risk factors present, and identifiable protective factors, including available and accessible social support.  PLAN OF CARE: Patient will be admitted to inpatient psychiatric unit for stabilization and safety. Will provide and encourage milieu participation. Provide medication management and maked adjustments as needed.  Will follow daily.    I certify that inpatient services furnished can reasonably be expected to improve the patient's condition.   Craige Cotta, MD 05/10/2018, 1:52 PM

## 2018-05-11 NOTE — Progress Notes (Signed)
Recreation Therapy Notes  Date:  2.10.20 Time: 0930 Location: [address] Dayroom  Group Topic: Stress Management  Goal Area(s) Addresses:  Patient will identify positive stress management techniques. Patient will identify benefits of using stress management post d/c.  Intervention: Stress Management  Activity :  Meditation.  LRT introduced the stress management technique of meditation.  LRT played a meditation that focused on being resilient in the face of adversity.  Patients were to listen as meditation played to engage in activity.    Education:  Stress Management, Discharge Planning.   Education Outcome: Acknowledges Education  Clinical Observations/Feedback:  Pt did not attend group.    Caroll Rancher, LRT/CTRS         Caroll Rancher A 05/11/2018 12:23 PM

## 2018-05-11 NOTE — Plan of Care (Signed)
D: Patient is alert, oriented, pleasant, and cooperative. Denies SI, HI, AVH, and verbally contracts for safety. Patient denies physical symptoms/pain. Patient reports a good day. He is happy with his care and his girlfriend is going to come visit tomorrow.    A: Medications administered per MD order. Support provided. Patient educated on safety on the unit and medications. Routine safety checks every 15 minutes. Patient stated understanding to tell nurse about any new physical symptoms. Patient understands to tell staff of any needs.     R: No adverse drug reactions noted. Patient verbally contracts for safety. Patient remains safe at this time and will continue to monitor.   Problem: Education: Goal: Mental status will improve Outcome: Progressing   Problem: Safety: Goal: Periods of time without injury will increase Outcome: Progressing   Patient denies SI, HI, AVH, and contracts for safety. Patient remains safe ad will continue to monitor.

## 2018-05-11 NOTE — Progress Notes (Signed)
[facility] MD Progress Note  05/11/2018 12:45 PM Randy Webster  MRN:  [id number] Subjective: Patient describes feeling better than he did on admission.  Today denies suicidal ideations.  Does not endorse medication side effects.  Objective: I have discussed case with treatment team and have met with patient.  43 year old male, presented to ED due to depression, neurovegetative symptoms, passive SI And  more recent thoughts of cutting wrists with a box cutter .  No psychotic symptoms. Reports prior history of depression and states he has had intermittent episodes of depression since his mother died when he was in his early 68s.  In addition to depression also endorses increased anxiety, some panic attacks.  Not taking any psychiatric medications prior to admission.  Medical history remarkable for hypothyroidism. Currently patient reports feeling better than he did on admission.  Denies suicidal ideations.  Contracts for safety on unit.  Presents calm, pleasant on approach.  Currently on Zoloft trial, tolerating well thus far.  Patient has a well-documented history of hypothyroidism.  TSH has been consistently elevated and was 187.6 on 2/ 7.  He reports he does not currently have a PCP, and has been accessing medical care by going to the ED when feeling ill.  This may be contributing to suboptimal compliance with Synthroid.  We have reviewed the potential for hypothyroidism to cause or contribute to depression.   Principal Problem: Depression  Diagnosis: Active Problems:   MDD (major depressive disorder), recurrent episode, severe ([place])  Total Time spent with patient: 20 minutes  Past Psychiatric History:   Past Medical History:  Past Medical History:  Diagnosis Date  . Anxiety   . Depression   . Thyroid disease    History reviewed. No pertinent surgical history. Family History: History reviewed. No pertinent family history. Family Psychiatric  History: Social History:  Social History    Substance and Sexual Activity  Alcohol Use Not Currently     Social History   Substance and Sexual Activity  Drug Use No    Social History   Socioeconomic History  . Marital status: Single    Spouse name: Not on file  . Number of children: Not on file  . Years of education: Not on file  . Highest education level: Not on file  Occupational History  . Not on file  Social Needs  . Financial resource strain: Not on file  . Food insecurity:    Worry: Not on file    Inability: Not on file  . Transportation needs:    Medical: Not on file    Non-medical: Not on file  Tobacco Use  . Smoking status: Current Every Day Smoker    Packs/day: 0.50    Types: Cigarettes  . Smokeless tobacco: Never Used  Substance and Sexual Activity  . Alcohol use: Not Currently  . Drug use: No  . Sexual activity: Not on file  Lifestyle  . Physical activity:    Days per week: Not on file    Minutes per session: Not on file  . Stress: Not on file  Relationships  . Social connections:    Talks on phone: Not on file    Gets together: Not on file    Attends religious service: Not on file    Active member of club or organization: Not on file    Attends meetings of clubs or organizations: Not on file    Relationship status: Not on file  Other Topics Concern  . Not on file  Social  History Narrative  . Not on file   Additional Social History:   Sleep: Good  Appetite:  Good  Current Medications: Current Facility-Administered Medications  Medication Dose Route Frequency Provider Last Rate Last Dose  . hydrOXYzine (ATARAX/VISTARIL) tablet 25 mg  25 mg Oral TID PRN Rozetta Nunnery, NP      . levothyroxine (SYNTHROID, LEVOTHROID) tablet 125 mcg  125 mcg Oral QAC breakfast Lindon Romp A, NP   125 mcg at 05/11/18 0618  . sertraline (ZOLOFT) tablet 50 mg  50 mg Oral Daily Lannah Koike, Myer Peer, MD   50 mg at 05/11/18 9528  . traZODone (DESYREL) tablet 50 mg  50 mg Oral QHS PRN Seirra Kos, Myer Peer, MD    50 mg at 05/10/18 2129    Lab Results: No results found for this or any previous visit (from the past 48 hour(s)).  Blood Alcohol level:  Lab Results  Component Value Date   ETH <10 05/08/2018   ETH <10 41/32/4401    Metabolic Disorder Labs: No results found for: HGBA1C, MPG No results found for: PROLACTIN No results found for: CHOL, TRIG, HDL, CHOLHDL, VLDL, LDLCALC  Physical Findings: AIMS: Facial and Oral Movements Muscles of Facial Expression: None, normal Lips and Perioral Area: None, normal Jaw: None, normal Tongue: None, normal,Extremity Movements Upper (arms, wrists, hands, fingers): None, normal Lower (legs, knees, ankles, toes): None, normal, Trunk Movements Neck, shoulders, hips: None, normal, Overall Severity Severity of abnormal movements (highest score from questions above): None, normal Incapacitation due to abnormal movements: None, normal Patient's awareness of abnormal movements (rate only patient's report): No Awareness, Dental Status Current problems with teeth and/or dentures?: No Does patient usually wear dentures?: No  CIWA:    COWS:     Musculoskeletal: Strength & Muscle Tone: within normal limits Gait & Station: normal Patient leans: N/A  Psychiatric Specialty Exam: Physical Exam  ROS no chest pain, no shortness of breath, no vomiting, no fever, no chills  Blood pressure 112/89, pulse 79, temperature (!) 97.5 F (36.4 C), temperature source Oral, resp. rate 16, height 5' 10.47" (1.79 m), weight 87.8 kg.Body mass index is 27.39 kg/m.  General Appearance: Fairly Groomed  Eye Contact:  Good  Speech:  Normal Rate  Volume:  Normal  Mood:  Reports feeling better than he did on admission  Affect:  Appropriate and Smiles briefly/appropriately at times  Thought Process:  Linear and Descriptions of Associations: Intact  Orientation:  Other:  Fully alert and attentive  Thought Content:  No hallucinations, no delusions  Suicidal Thoughts:  No denies  suicidal or self-injurious ideations, no homicidal or violent ideations  Homicidal Thoughts:  No  Memory:  Recent and remote grossly intact  Judgement:  Other:  Improving  Insight:  Improving  Psychomotor Activity:  Normal  Concentration:  Concentration: Good and Attention Span: Good  Recall:  Good  Fund of Knowledge:  Good  Language:  Good  Akathisia:  Negative  Handed:  Right  AIMS (if indicated):     Assets:  Communication Skills Desire for Improvement Resilience  ADL's:  Intact  Cognition:  WNL  Sleep:  Number of Hours: 6    Assessment- 43 year old male, presented to ED due to depression, neurovegetative symptoms, passive SI And  more recent thoughts of cutting wrists with a box cutter .  No psychotic symptoms. Reports prior history of depression and states he has had intermittent episodes of depression since his mother died when he was in his early 34s.  In  addition to depression also endorses increased anxiety, some panic attacks.  Not taking any psychiatric medications prior to admission.  Medical history remarkable for hypothyroidism.  Patient reports feeling better than he did on admission.  Today denies suicidal ideations.  Currently tolerating Zoloft trial well.  Hypothyroidism being managed with Synthroid.  Treatment Plan Summary: Daily contact with patient to assess and evaluate symptoms and progress in treatment, Medication management, Plan Inpatient treatment and Medications as below Encourage group and milieu participation to work on coping skills and symptom reduction Continue Zoloft 50 mg daily for depression Continue trazodone 50 mg at nighttime for insomnia as needed Continue Synthroid 125 mcg daily for hypothyroidism Treatment team working on disposition planning options  Jenne Campus, MD 05/11/2018, 12:45 PM

## 2018-05-11 NOTE — Progress Notes (Signed)
Adult Psychoeducational Group Note  Date:  05/11/2018 Time:  10:20 PM  Group Topic/Focus:  Wrap-Up Group:   The focus of this group is to help patients review their daily goal of treatment and discuss progress on daily workbooks.  Participation Level:  Active  Participation Quality:  Appropriate  Affect:  Appropriate  Cognitive:  Appropriate  Insight: Appropriate  Engagement in Group:  Engaged  Modes of Intervention:  Discussion  Additional Comments:  Patient attended group and participate.   Tali Coster W Roza Creamer 05/11/2018, 10:20 PM

## 2018-05-11 NOTE — [facility] Group Notes (Signed)
[facility] Group Notes:  (Nursing/MHT/Case Management/Adjunct)  Date:  05/11/2018  Time:  4:00 pm  Type of Therapy:  Psychoeducational Skills  Participation Level:  Active  Participation Quality:  Appropriate  Affect:  Appropriate  Cognitive:  Appropriate  Insight:  Appropriate  Engagement in Group:  Engaged  Modes of Intervention:  Education  Summary of Progress/Problems: Patient was alert and appropriate during group.   Randy Webster 05/11/2018, 6:56 PM

## 2018-05-11 NOTE — Progress Notes (Signed)
D:Pt has been out in the dayroom interacting with peers and smiles upon approach. Pt rates depression and anxiety as a 0 on 0-10 scale with 10 being the most. Pt is taking his prescribed medications and tolerating well. A:Offered support, encouragement and 15 minute checks.  R:Pt denies si and hi. Safety maintained on the unit.

## 2018-05-11 NOTE — [facility] Group Notes (Signed)
LCSW Group Therapy Note 05/11/2018 3:11 PM  Type of Therapy and Topic: Group Therapy: Overcoming Obstacles  Participation Level: Active  Description of Group:  In this group patients will be encouraged to explore what they see as obstacles to their own wellness and recovery. They will be guided to discuss their thoughts, feelings, and behaviors related to these obstacles. The group will process together ways to cope with barriers, with attention given to specific choices patients can make. Each patient will be challenged to identify changes they are motivated to make in order to overcome their obstacles. This group will be process-oriented, with patients participating in exploration of their own experiences as well as giving and receiving support and challenge from other group members.  Therapeutic Goals: 1. Patient will identify personal and current obstacles as they relate to admission. 2. Patient will identify barriers that currently interfere with their wellness or overcoming obstacles.  3. Patient will identify feelings, thought process and behaviors related to these barriers. 4. Patient will identify two changes they are willing to make to overcome these obstacles:   Summary of Patient Progress  Arhaan was engaged and participated throughout the group session. Kaire states that his main obstacle is "financial problems". Avelardo reports that he has a mental health history of depression and anxiety. He shared with the group that due to an injury, he has been on crutches the last three months have been unable to find employment. Rae states that he was able to manage his symptoms, however after experiencing financial strain he began to decompensate.    Therapeutic Modalities:  Cognitive Behavioral Therapy Solution Focused Therapy Motivational Interviewing Relapse Prevention Therapy   Alcario Drought Clinical Social Worker

## 2018-05-11 NOTE — Tx Team (Signed)
Interdisciplinary Treatment and Diagnostic Plan Update  05/11/2018 Time of Session:  Randy KohlerJames Webster MRN: [id number]  Principal Diagnosis: <principal problem not specified>  Secondary Diagnoses: Active Problems:   MDD (major depressive disorder), recurrent episode, severe ([facility])   Current Medications:  Current Facility-Administered Medications  Medication Dose Route Frequency Provider Last Rate Last Dose  . hydrOXYzine (ATARAX/VISTARIL) tablet 25 mg  25 mg Oral TID PRN Jackelyn PolingBerry, Jason A, NP      . levothyroxine (SYNTHROID, LEVOTHROID) tablet 125 mcg  125 mcg Oral QAC breakfast Nira ConnBerry, Jason A, NP   125 mcg at 05/11/18 0618  . sertraline (ZOLOFT) tablet 50 mg  50 mg Oral Daily Cobos, Rockey SituFernando A, MD   50 mg at 05/11/18 [id number]  . traZODone (DESYREL) tablet 50 mg  50 mg Oral QHS PRN Cobos, Rockey SituFernando A, MD   50 mg at 05/10/18 2129   PTA Medications: Medications Prior to Admission  Medication Sig Dispense Refill Last Dose  . cyclobenzaprine (FLEXERIL) 5 MG tablet Take 1 tablet (5 mg total) by mouth 2 (two) times daily as needed for muscle spasms. 20 tablet 0 not started  . furosemide (LASIX) 20 MG tablet Take 1 tablet (20 mg total) by mouth daily. (Patient not taking: Reported on 04/25/2018) 7 tablet 0 Not Taking at Unknown time  . levothyroxine (SYNTHROID, LEVOTHROID) 125 MCG tablet Take 125 mcg by mouth daily before breakfast.   05/08/2018 at Unknown time  . levothyroxine (SYNTHROID, LEVOTHROID) 50 MCG tablet Take 1 tablet (50 mcg total) by mouth daily before breakfast. (Patient not taking: Reported on 04/25/2018) 30 tablet 0 Not Taking at Unknown time  . meclizine (ANTIVERT) 25 MG tablet Take 1 tablet (25 mg total) by mouth 3 (three) times daily as needed for up to 20 doses for dizziness. (Patient not taking: Reported on 04/25/2018) 20 tablet 0 Completed Course at Unknown time  . metoCLOPramide (REGLAN) 10 MG tablet Take 1 tablet (10 mg total) by mouth every 6 (six) hours. (Patient not taking: Reported on  05/06/2018) 15 tablet 0 Completed Course at Unknown time  . potassium chloride SA (K-DUR,KLOR-CON) 20 MEQ tablet Take 1 tablet (20 mEq total) by mouth 2 (two) times daily for 7 days. (Patient not taking: Reported on 04/25/2018) 14 tablet 0 Completed Course at Unknown time    Patient Stressors: Financial difficulties Other: aniversary of mother's death (died 20 years ago march)  Patient Strengths: Wellsite geologistCommunication skills General fund of knowledge Physical Health  Treatment Modalities: Medication Management, Group therapy, Case management,  1 to 1 session with clinician, Psychoeducation, Recreational therapy.   Physician Treatment Plan for Primary Diagnosis: <principal problem not specified> Long Term Goal(s): Improvement in symptoms so as ready for discharge Improvement in symptoms so as ready for discharge   Short Term Goals: Ability to identify changes in lifestyle to reduce recurrence of condition will improve Ability to verbalize feelings will improve Ability to disclose and discuss suicidal ideas Ability to demonstrate self-control will improve Ability to identify and develop effective coping behaviors will improve Ability to maintain clinical measurements within normal limits will improve Ability to identify changes in lifestyle to reduce recurrence of condition will improve Ability to verbalize feelings will improve Ability to disclose and discuss suicidal ideas Ability to demonstrate self-control will improve Ability to identify and develop effective coping behaviors will improve Ability to maintain clinical measurements within normal limits will improve  Medication Management: Evaluate patient's response, side effects, and tolerance of medication regimen.  Therapeutic Interventions: 1 to 1 sessions, Unit  Group sessions and Medication administration.  Evaluation of Outcomes: Progressing  Physician Treatment Plan for Secondary Diagnosis: Active Problems:   MDD (major depressive  disorder), recurrent episode, severe ([facility])  Long Term Goal(s): Improvement in symptoms so as ready for discharge Improvement in symptoms so as ready for discharge   Short Term Goals: Ability to identify changes in lifestyle to reduce recurrence of condition will improve Ability to verbalize feelings will improve Ability to disclose and discuss suicidal ideas Ability to demonstrate self-control will improve Ability to identify and develop effective coping behaviors will improve Ability to maintain clinical measurements within normal limits will improve Ability to identify changes in lifestyle to reduce recurrence of condition will improve Ability to verbalize feelings will improve Ability to disclose and discuss suicidal ideas Ability to demonstrate self-control will improve Ability to identify and develop effective coping behaviors will improve Ability to maintain clinical measurements within normal limits will improve     Medication Management: Evaluate patient's response, side effects, and tolerance of medication regimen.  Therapeutic Interventions: 1 to 1 sessions, Unit Group sessions and Medication administration.  Evaluation of Outcomes: Progressing   RN Treatment Plan for Primary Diagnosis: <principal problem not specified> Long Term Goal(s): Knowledge of disease and therapeutic regimen to maintain health will improve  Short Term Goals: Ability to participate in decision making will improve, Ability to verbalize feelings will improve, Ability to disclose and discuss suicidal ideas, Ability to identify and develop effective coping behaviors will improve and Compliance with prescribed medications will improve  Medication Management: RN will administer medications as ordered by provider, will assess and evaluate patient's response and provide education to patient for prescribed medication. RN will report any adverse and/or side effects to prescribing provider.  Therapeutic  Interventions: 1 on 1 counseling sessions, Psychoeducation, Medication administration, Evaluate responses to treatment, Monitor vital signs and CBGs as ordered, Perform/monitor CIWA, COWS, AIMS and Fall Risk screenings as ordered, Perform wound care treatments as ordered.  Evaluation of Outcomes: Progressing   LCSW Treatment Plan for Primary Diagnosis: <principal problem not specified> Long Term Goal(s): Safe transition to appropriate next level of care at discharge, Engage patient in therapeutic group addressing interpersonal concerns.  Short Term Goals: Engage patient in aftercare planning with referrals and resources  Therapeutic Interventions: Assess for all discharge needs, 1 to 1 time with Social worker, Explore available resources and support systems, Assess for adequacy in community support network, Educate family and significant other(s) on suicide prevention, Complete Psychosocial Assessment, Interpersonal group therapy.  Evaluation of Outcomes: Progressing   Progress in Treatment: Attending groups: Yes. Participating in groups: Yes. Taking medication as prescribed: Yes. Toleration medication: Yes. Family/Significant other contact made: No, will contact:  the patient's wife Patient understands diagnosis: Yes. Discussing patient identified problems/goals with staff: Yes. Medical problems stabilized or resolved: Yes. Denies suicidal/homicidal ideation: Yes. Issues/concerns per patient self-inventory: No. Other:    New problem(s) identified: None   New Short Term/Long Term Goal(s):  medication stabilization, elimination of SI thoughts, development of comprehensive mental wellness plan.    Patient Goals:  Help with depression, anxiety and suicidal thoughts   Discharge Plan or Barriers: Patient plans to discharge home with friends and will follow up with [facility] for outpatient psychiatric services. CSW will continue to follow and assess for possible referrals and discharge  plans.   Reason for Continuation of Hospitalization: Anxiety Depression Medication stabilization Suicidal ideation  Estimated Length of Stay: 05/15/2018  Attendees: Patient: 05/11/2018 11:21 AM  Physician: Dr. Nehemiah Massed, MD  05/11/2018 11:21 AM  Nursing: Jan.W, RN 05/11/2018 11:21 AM  RN Care Manager: 05/11/2018 11:21 AM  Social Worker: Baldo Daub, LCSWA 05/11/2018 11:21 AM  Recreational Therapist:  05/11/2018 11:21 AM  Other: Marciano Sequin, NP  05/11/2018 11:21 AM  Other:  05/11/2018 11:21 AM  Other: 05/11/2018 11:21 AM    Scribe for Treatment Team: Maeola Sarah, LCSWA 05/11/2018 11:21 AM

## 2018-05-12 NOTE — Progress Notes (Signed)
Patient attended grief and loss group facilitated by Burnis Kingfisher, Mdiv, [facility], and Ethel Rana, MS, [facility], [facility].   Group focuses on change and loss experienced by group members; topics include loss due to death, loss of relationships, loss of a place, loss of sense of self, etc. Group members are invited to share the changes and losses that are currently affecting their lives. Facilitators tie together shared experiences between group members and validate emotions felt by participants.  Patient Randy Webster attended and participated in group. Pt shared about the loss of his mother 20 years ago. Pt reports the anniversary of her death is 15-Jul-2022. Pt reports that he makes plans for the anniversary of her death months ahead of time so that he is prepared. Pt shared that his fiance is very supportive and helps him cope with his grief. Pt connected his grief experience to others in the group.

## 2018-05-12 NOTE — Progress Notes (Signed)
[facility]  05/12/2018 11:29 AM Randy KohlerJames Webster  MRN:  [id number] Subjective: Patient is seen and examined.  Patient is a 43 year old male with a past psychiatric history significant for depression who presented originally on 2/6 [facility] department with suicidal ideation.  He was admitted to the hospital for evaluation and stabilization.  Objective: Patient is seen and examined.  Patient is a 43 year old male with the above-stated past psychiatric history seen in follow-up.  He stated he is doing well.  He stated he is not suicidal.  He stated he is tolerating his medications.  He stated that 1 of the reasons why he had been depressed was because he was on crutches, no one would hire him on crutches.  This is gotten better.  He continues on Zoloft 50 mg p.o. daily.  His TSH was significantly elevated on admission, and his levothyroxine was restarted at 125 mcg p.o. daily.  His vital signs are stable, he is afebrile.  Review of his laboratories revealed a TSH of 187.  Drug screen was positive for marijuana, blood alcohol was less than 10.  Principal Problem: <principal problem not specified> Diagnosis: Active Problems:   MDD (major depressive disorder), recurrent episode, severe (HCC)  Total Time spent with patient: 20 minutes  Past Psychiatric History: See admission H&P  Past Medical History:  Past Medical History:  Diagnosis Date  . Anxiety   . Depression   . Thyroid disease    History reviewed. No pertinent surgical history. Family History: History reviewed. No pertinent family history. Family Psychiatric  History: See admission H&P Social History:  Social History   Substance and Sexual Activity  Alcohol Use Not Currently     Social History   Substance and Sexual Activity  Drug Use No    Social History   Socioeconomic History  . Marital status: Single    Spouse name: Not on file  . Number of children: Not on file  . Years of  education: Not on file  . Highest education level: Not on file  Occupational History  . Not on file  Social Needs  . Financial resource strain: Not on file  . Food insecurity:    Worry: Not on file    Inability: Not on file  . Transportation needs:    Medical: Not on file    Non-medical: Not on file  Tobacco Use  . Smoking status: Current Every Day Smoker    Packs/day: 0.50    Types: Cigarettes  . Smokeless tobacco: Never Used  Substance and Sexual Activity  . Alcohol use: Not Currently  . Drug use: No  . Sexual activity: Not on file  Lifestyle  . Physical activity:    Days per week: Not on file    Minutes per session: Not on file  . Stress: Not on file  Relationships  . Social connections:    Talks on phone: Not on file    Gets together: Not on file    Attends religious service: Not on file    Active member of club or organization: Not on file    Attends meetings of clubs or organizations: Not on file    Relationship status: Not on file  Other Topics Concern  . Not on file  Social History Narrative  . Not on file   Additional Social History:  Sleep: Good  Appetite:  Good  Current Medications: Current Facility-Administered Medications  Medication Dose Route Frequency Provider Last Rate Last Dose  . hydrOXYzine (ATARAX/VISTARIL) tablet 25 mg  25 mg Oral TID PRN Jackelyn Poling, NP      . levothyroxine (SYNTHROID, LEVOTHROID) tablet 125 mcg  125 mcg Oral QAC breakfast Nira Conn A, NP   125 mcg at 05/12/18 0626  . sertraline (ZOLOFT) tablet 50 mg  50 mg Oral Daily Cobos, Rockey Situ, MD   50 mg at 05/12/18 0808  . traZODone (DESYREL) tablet 50 mg  50 mg Oral QHS PRN Cobos, Rockey Situ, MD   50 mg at 05/11/18 2133    Lab Results: No results found for this or any previous visit (from the past 48 hour(s)).  Blood Alcohol level:  Lab Results  Component Value Date   ETH <10 05/08/2018   ETH <10 05/07/2018    Metabolic Disorder  Labs: No results found for: HGBA1C, MPG No results found for: PROLACTIN No results found for: CHOL, TRIG, HDL, CHOLHDL, VLDL, LDLCALC  Physical Findings: AIMS: Facial and Oral Movements Muscles of Facial Expression: None, normal Lips and Perioral Area: None, normal Jaw: None, normal Tongue: None, normal,Extremity Movements Upper (arms, wrists, hands, fingers): None, normal Lower (legs, knees, ankles, toes): None, normal, Trunk Movements Neck, shoulders, hips: None, normal, Overall Severity Severity of abnormal movements (highest score from questions above): None, normal Incapacitation due to abnormal movements: None, normal Patient's awareness of abnormal movements (rate only patient's report): No Awareness, Dental Status Current problems with teeth and/or dentures?: No Does patient usually wear dentures?: No  CIWA:    COWS:     Musculoskeletal: Strength & Muscle Tone: within normal limits Gait & Station: normal Patient leans: N/A  Psychiatric Specialty Exam: Physical Exam  Nursing note and vitals reviewed. Constitutional: He is oriented to person, place, and time. He appears well-developed and well-nourished.  HENT:  Head: Normocephalic and atraumatic.  Respiratory: Effort normal.  Neurological: He is alert and oriented to person, place, and time.    ROS  Blood pressure 109/79, pulse 82, temperature (!) 97.5 F (36.4 C), temperature source Oral, resp. rate 16, height 5' 10.47" (1.79 m), weight 87.8 kg.Body mass index is 27.39 kg/m.  General Appearance: Casual  Eye Contact:  Good  Speech:  Normal Rate  Volume:  Normal  Mood:  Euthymic  Affect:  Congruent  Thought Process:  Coherent and Descriptions of Associations: Intact  Orientation:  Full (Time, Place, and Person)  Thought Content:  Logical  Suicidal Thoughts:  No  Homicidal Thoughts:  No  Memory:  Immediate;   Fair Recent;   Fair Remote;   Fair  Judgement:  Intact  Insight:  Fair  Psychomotor Activity:   Increased  Concentration:  Concentration: Fair and Attention Span: Fair  Recall:  [employer] of Knowledge:  Fair  Language:  Fair  Akathisia:  Negative  Handed:  Right  AIMS (if indicated):     Assets:  Communication Skills Desire for Improvement Housing Leisure Time Physical Health Resilience  ADL's:  Intact  Cognition:  WNL  Sleep:  Number of Hours: 5.75     Treatment Plan Summary: Daily contact with patient to assess and evaluate symptoms and progress in treatment, Medication management and Plan : Patient is seen and examined.  Patient is a 43 year old male with the above-stated past psychiatric history seen in follow-up.  He continues to slowly improve.  No suicidal ideation today.  No change  in his current medications. 1.  Continue hydroxyzine 25 mg p.o. 3 times daily as needed anxiety. 2.  Continue levothyroxine 125 mcg p.o. daily for hypothyroidism. 3.  Continue Zoloft 50 mg p.o. daily for mood and anxiety. 4.  Continue trazodone 50 mg p.o. nightly as needed insomnia. 5.  Disposition planning-in progress.  Antonieta Pert, MD 05/12/2018, 11:29 AM

## 2018-05-12 NOTE — Progress Notes (Signed)
Patient ID: Randy Webster, male   DOB: May 05, 1975, 43 y.o.   MRN: [id number] D: Patient in the dayroom with visitor on approach. Pt reports he is getting better and found out about possible discharge on Wednesday. Pt attended evening wrap up group and engaged in discussions. Denies  SI/HI/AVH and pain.No behavioral issues noted.  A: Support and encouragement offered as needed to express needs. Medications administered as prescribed.  R: Patient is safe and cooperative on unit. Will continue to monitor  for safety and stability.

## 2018-05-12 NOTE — Progress Notes (Signed)
Patient ID: Randy Webster, male   DOB: 30-Apr-1975, 43 y.o.   MRN: [id number] Pt asked for belonging in locker given to pt's significant other (carolyn Hunt). Items included [place] duffle bag, wallet with [place] driver's lincense, food stamp and other belongings.

## 2018-05-12 NOTE — Plan of Care (Signed)
  Problem: Activity: Goal: Interest or engagement in activities will improve Outcome: Progressing   Problem: Safety: Goal: Periods of time without injury will increase Outcome: Progressing  DAR NOTE: Patient presents with anxious affect and depressed mood.  Denies suicidal thoughts, pain, auditory and visual hallucinations.  Described energy level as normal and concentration as good.  Rates depression at 0, hopelessness at 0, and anxiety at 0.  Maintained on routine safety checks.  Medications given as prescribed.  Support and encouragement offered as needed.  Attended group and participated.  States goal for today is "hopefully going home back to normal life."  Patient visible in milieu with minimal interactions with staff and peers.  Patient remained safe on and off the unit.

## 2018-05-12 NOTE — B[facility] Group Notes (Signed)
Adult Psychoeducational Group Note  Date:  05/12/2018 Time:  10:22 PM  Group Topic/Focus:  Wrap-Up Group:   The focus of this group is to help patients review their daily goal of treatment and discuss progress on daily workbooks.  Participation Level:  Active  Participation Quality:  Appropriate and Attentive  Affect:  Appropriate  Cognitive:  Alert and Appropriate  Insight: Appropriate and Good  Engagement in Group:  Engaged  Modes of Intervention:  Discussion and Education  Additional Comments:  Pt attended and participated in wrap up group this evening. Pt rated their day a 10/10, due to them getting good news from the Dr and being discharged tomorrow.   Chrisandra Netters 05/12/2018, 10:22 PM

## 2018-05-12 NOTE — Progress Notes (Signed)
Patient ID: Randy Webster, male   DOB: [birth year], 43 y.o.   MRN: [id number] D: Patient reports he had a good engaging in milieu and interacting with peers. Pt reports he is hoping to discharge tomorrow.  Pt attended evening wrap up group and engaged in discussions. Denies  SI/HI/AVH and pain.No behavioral issues noted.  A: Support and encouragement offered as needed to express needs. Medications administered as prescribed.  R: Patient is safe and cooperative on unit. Will continue to monitor  for safety and stability.

## 2018-05-13 ENCOUNTER — Emergency Department (HOSPITAL_COMMUNITY)
Admission: EM | Admit: 2018-05-13 | Discharge: 2018-05-14 | Disposition: A | Discharge status: Home / Self Care | Payer: Self-pay | Attending: Emergency Medicine | Admitting: Emergency Medicine

## 2018-05-13 ENCOUNTER — Emergency Department (HOSPITAL_COMMUNITY): Payer: Self-pay

## 2018-05-13 DIAGNOSIS — Y999 Unspecified external cause status: Secondary | ICD-10-CM | POA: Insufficient documentation

## 2018-05-13 DIAGNOSIS — Y939 Activity, unspecified: Secondary | ICD-10-CM | POA: Insufficient documentation

## 2018-05-13 DIAGNOSIS — Y929 Unspecified place or not applicable: Secondary | ICD-10-CM | POA: Insufficient documentation

## 2018-05-13 DIAGNOSIS — S93402A Sprain of unspecified ligament of left ankle, initial encounter: Secondary | ICD-10-CM

## 2018-05-13 DIAGNOSIS — X501XXA Overexertion from prolonged static or awkward postures, initial encounter: Secondary | ICD-10-CM | POA: Insufficient documentation

## 2018-05-13 DIAGNOSIS — E079 Disorder of thyroid, unspecified: Secondary | ICD-10-CM | POA: Insufficient documentation

## 2018-05-13 DIAGNOSIS — Z79899 Other long term (current) drug therapy: Secondary | ICD-10-CM | POA: Insufficient documentation

## 2018-05-13 MED ORDER — TRAZODONE HCL 50 MG PO TABS: 50.0000 mg | ORAL_TABLET | Freq: Every evening | ORAL | 0 refills | Status: AC | PRN

## 2018-05-13 MED ORDER — SERTRALINE HCL 50 MG PO TABS: 50.0000 mg | ORAL_TABLET | Freq: Every day | ORAL | 0 refills | Status: AC

## 2018-05-13 MED ORDER — HYDROXYZINE HCL 25 MG PO TABS: 25.0000 mg | ORAL_TABLET | Freq: Three times a day (TID) | ORAL | 0 refills | Status: AC | PRN

## 2018-05-13 NOTE — [facility] Suicide Risk Assessment (Signed)
[facility] INPATIENT:  Family/Significant Other Suicide Prevention Education  Suicide Prevention Education:  Education Completed; with Randy Webster [phone]) has been identified by the patient as the family member/significant other with whom the patient will be residing, and identified as the person(s) who will aid the patient in the event of a mental health crisis (suicidal ideations/suicide attempt).  With written consent from the patient, the family member/significant other has been provided the following suicide prevention education, prior to the and/or following the discharge of the patient.  The suicide prevention education provided includes the following:  Suicide risk factors  Suicide prevention and interventions  National Suicide Hotline telephone number  [facility] assessment telephone number  [facility] Emergency Assistance 911  [facility] and/or Residential Mobile Crisis Unit telephone number  Request made of family/significant other to:  Remove weapons (e.g., guns, rifles, knives), all items previously/currently identified as safety concern.    Remove drugs/medications (over-the-counter, prescriptions, illicit drugs), all items previously/currently identified as a safety concern.  The family member/significant other verbalizes understanding of the suicide prevention education information provided.  The family member/significant other agrees to remove the items of safety concern listed above.  Patient's fiance reports that she does not have any questions or concerns regarding the patient's discharge. CSW will continue to follow.   Randy Webster 05/13/2018, 9:37 AM

## 2018-05-13 NOTE — Progress Notes (Signed)
Pt discharged home on a bus pass. Pt was ambulatory, stable and appreciative at that time. All papers, prescriptions and medication samples were given and valuables returned. Verbal understanding expressed. Denies SI/HI and A/VH. Pt given opportunity to express concerns and ask questions.

## 2018-05-13 NOTE — [facility] Suicide Risk Assessment (Signed)
[facility] Discharge Suicide Risk Assessment   Principal Problem: <principal problem not specified> Discharge Diagnoses: Active Problems:   MDD (major depressive disorder), recurrent episode, severe (HCC)   Total Time spent with patient: 15 minutes  Musculoskeletal: Strength & Muscle Tone: within normal limits Gait & Station: normal Patient leans: N/A  Psychiatric Specialty Exam: Review of Systems  All other systems reviewed and are negative.   Blood pressure 122/81, pulse 79, temperature 97.6 F (36.4 C), temperature source Oral, resp. rate 16, height 5' 10.47" (1.79 m), weight 87.8 kg.Body mass index is 27.39 kg/m.  General Appearance: Casual  Eye Contact::  Fair  Speech:  Normal Rate409  Volume:  Normal  Mood:  Euthymic  Affect:  Congruent  Thought Process:  Coherent and Descriptions of Associations: Intact  Orientation:  Full (Time, Place, and Person)  Thought Content:  Logical  Suicidal Thoughts:  No  Homicidal Thoughts:  No  Memory:  Immediate;   Fair Recent;   Fair Remote;   Fair  Judgement:  Intact  Insight:  Fair  Psychomotor Activity:  Normal  Concentration:  Good  Recall:  Good  Fund of Knowledge:Good  Language: Good  Akathisia:  Negative  Handed:  Right  AIMS (if indicated):     Assets:  Communication Skills Desire for Improvement Housing Physical Health Resilience  Sleep:  Number of Hours: 6.5  Cognition: WNL  ADL's:  Intact   Mental Status Per Nursing Assessment::   On Admission:  Suicidal ideation indicated by patient, Suicide plan, Plan includes specific time, place, or method  Demographic Factors:  Male, Low socioeconomic status and Unemployed  Loss Factors: Financial problems/change in socioeconomic status  Historical Factors: Impulsivity  Risk Reduction Factors:   Sense of responsibility to family and Living with another person, especially a relative  Continued Clinical Symptoms:  Depression:   Impulsivity  Cognitive Features That  Contribute To Risk:  None    Suicide Risk:  Minimal: No identifiable suicidal ideation.  Patients presenting with no risk factors but with morbid ruminations; may be classified as minimal risk based on the severity of the depressive symptoms  Follow-up Information    [facility] Follow up on 05/20/2018.   Why:  Your next medication management appointment with Corrie Dandy is Wednesday, 2/19 at 10:00a. Please bring current medications and discharge paperwork from this hospitalization.  Your first therapy appointment with Susy Frizzle is Friday, 2/28 at 12:00p.  Contact information: [address] [place] [address] [phone]           Plan Of Care/Follow-up recommendations:  Activity:  ad lib  Antonieta Pert, MD 05/13/2018, 7:24 AM

## 2018-05-13 NOTE — Progress Notes (Signed)
  [facility] Adult Case Management Discharge Plan :  Will you be returning to the same living situation after discharge:  Yes,  patient reports he is returning home with his family At discharge, do you have transportation home?: Yes,  bus passes Do you have the ability to pay for your medications: No.  Release of information consent forms completed and in the chart;  Patient's signature needed at discharge.  Patient to Follow up at: Follow-up Information    [facility] Follow up on 05/20/2018.   Why:  Your next medication management appointment with Corrie Dandy is Wednesday, 2/19 at 10:00a. Please bring current medications and discharge paperwork from this hospitalization.  Your first therapy appointment with Susy Frizzle is Friday, 2/28 at 12:00p.  Contact information: [address] [place] [address] [phone]           Next level of care provider has access to [facility] Link:yes  Safety Planning and Suicide Prevention discussed: Yes,  with the patient's fiance  Have you used any form of tobacco in the last 30 days? (Cigarettes, Smokeless Tobacco, Cigars, and/or Pipes): Yes  Has patient been referred to the Quitline?: Patient refused referral  Patient has been referred for addiction treatment: N/A  Maeola Sarah, LCSWA 05/13/2018, 9:39 AM

## 2018-05-13 NOTE — Discharge Summary (Signed)
Physician Discharge Summary Note  Patient:  Randy Webster is an 43 y.o., male MRN:  [id number] DOB:  [birth year] Patient phone:  [phone] (home)  Patient address[place]   [address] [place] [address],  Total Time spent with patient: 15 minutes  Date of Admission:  05/09/2018 Date of Discharge: 05/13/2018  Reason for Admission:  Depression with suicidal ideation  Principal Problem: MDD (major depressive disorder), recurrent episode, severe (HCC) Discharge Diagnoses: Principal Problem:   MDD (major depressive disorder), recurrent episode, severe (HCC)   Past Psychiatric History: Per admission H&P: one prior psychiatric admission in 2013 for anxiety, depression. Reports history of suicidal ideations, denies attempts. Denies history of self cutting . Denies history of psychosis.  Reports history of depression, which started after his mother died when he was in his early 6720s. States depression has been intermittent. Does not endorse any clear history of hypomania or mania. States he has not been on psychiatric medications in the past. Reports history of panic attacks and a tendency to worry excessively. Denies agoraphobia. Denies history of violence .  Past Medical History:  Past Medical History:  Diagnosis Date  . Anxiety   . Depression   . Thyroid disease    History reviewed. No pertinent surgical history. Family History: History reviewed. No pertinent family history. Family Psychiatric  History: Per admission H&P: No known history of mental illness in family, grandfather and mother had history of alcohol abuse. No known suicides in family. Social History:  Social History   Substance and Sexual Activity  Alcohol Use Not Currently     Social History   Substance and Sexual Activity  Drug Use No    Social History   Socioeconomic History  . Marital status: Single    Spouse name: Not on file  . Number of children: Not on file  . Years of education: Not on file  . Highest education  level: Not on file  Occupational History  . Not on file  Social Needs  . Financial resource strain: Not on file  . Food insecurity:    Worry: Not on file    Inability: Not on file  . Transportation needs:    Medical: Not on file    Non-medical: Not on file  Tobacco Use  . Smoking status: Current Every Day Smoker    Packs/day: 0.50    Types: Cigarettes  . Smokeless tobacco: Never Used  Substance and Sexual Activity  . Alcohol use: Not Currently  . Drug use: No  . Sexual activity: Not on file  Lifestyle  . Physical activity:    Days per week: Not on file    Minutes per session: Not on file  . Stress: Not on file  Relationships  . Social connections:    Talks on phone: Not on file    Gets together: Not on file    Attends religious service: Not on file    Active member of club or organization: Not on file    Attends meetings of clubs or organizations: Not on file    Relationship status: Not on file  Other Topics Concern  . Not on file  Social History Narrative  . Not on file    Hospital Course:  Per admission H&P 05/10/2018: 43 year old male, presented to ED for depression. Reports he was discharged after a period of observation but again felt more depressed and returned to ED the next day, endorsing worsening depression, passive SI, and neuro vegetative symptoms as below.  States he has been feeling significantly depressed and anxious over recent weeks. Attributes symptoms in part to significant stressors- he states that up to recently he had been using crutches for several weeks, unable to walk much or work, financial difficulties, and upcoming anniversary of mother's death. Reported intermittent passive SI, without plan or intention, but states he did recently have thoughts of cutting wrists with a box cutter, which led him to throw box cutter away in the woods , so as to not have access to it. Endorses neuro-vegetative symptoms, denies psychotic symptoms  [name] was admitted  for depression with suicidal ideation. He was started on Zoloft, PRN Vistaril, and PRN trazodone. TSH on admit was 187. Synthroid was increased from 50 mcg daily to 125 mcg daily. [name] remained on the [facility] unit for 4 days. He stabilized with medication and therapy. Patient was discharged on the medications listed below. He has shown improvement with improved mood, affect, sleep, appetite, and interaction. He denies any SI/HI/AVH and contracts for safety. Patient agrees to follow up at [facility] (see below). Patient is provided with prescriptions for their medications upon discharge. He is discharging with a bus pass to go home.  Physical Findings: AIMS: Facial and Oral Movements Muscles of Facial Expression: None, normal Lips and Perioral Area: None, normal Jaw: None, normal Tongue: None, normal,Extremity Movements Upper (arms, wrists, hands, fingers): None, normal Lower (legs, knees, ankles, toes): None, normal, Trunk Movements Neck, shoulders, hips: None, normal, Overall Severity Severity of abnormal movements (highest score from questions above): None, normal Incapacitation due to abnormal movements: None, normal Patient's awareness of abnormal movements (rate only patient's report): No Awareness, Dental Status Current problems with teeth and/or dentures?: No Does patient usually wear dentures?: No  CIWA:    COWS:     Musculoskeletal: Strength & Muscle Tone: within normal limits Gait & Station: normal Patient leans: N/A  Psychiatric Specialty Exam: Physical Exam  Nursing note and vitals reviewed. Constitutional: He is oriented to person, place, and time. He appears well-developed and well-nourished.  Cardiovascular: Normal rate.  Respiratory: Effort normal.  Neurological: He is alert and oriented to person, place, and time.    Review of Systems  Constitutional: Negative.   Psychiatric/Behavioral: Positive for depression (improving) and substance abuse (UDS +THC). Negative for  hallucinations, memory loss and suicidal ideas. The patient is not nervous/anxious and does not have insomnia.     Blood pressure 122/81, pulse 79, temperature 97.6 F (36.4 C), temperature source Oral, resp. rate 16, height 5' 10.47" (1.79 m), weight 87.8 kg.Body mass index is 27.39 kg/m.  See MD's discharge SRA     Have you used any form of tobacco in the last 30 days? (Cigarettes, Smokeless Tobacco, Cigars, and/or Pipes): Yes  Has this patient used any form of tobacco in the last 30 days? (Cigarettes, Smokeless Tobacco, Cigars, and/or Pipes)  No  Blood Alcohol level:  Lab Results  Component Value Date   ETH <10 05/08/2018   ETH <10 05/07/2018    Metabolic Disorder Labs:  No results found for: HGBA1C, MPG No results found for: PROLACTIN No results found for: CHOL, TRIG, HDL, CHOLHDL, VLDL, LDLCALC  See Psychiatric Specialty Exam and Suicide Risk Assessment completed by Attending Physician prior to discharge.  Discharge destination:  Home  Is patient on multiple antipsychotic therapies at discharge:  No   Has Patient had three or more failed trials of antipsychotic monotherapy by history:  No  Recommended Plan for Multiple Antipsychotic Therapies: NA  Allergies as of 05/13/2018   No Known Allergies     Medication List    STOP taking these medications   cyclobenzaprine 5 MG tablet Commonly known as:  FLEXERIL   furosemide 20 MG tablet Commonly known as:  LASIX   meclizine 25 MG tablet Commonly known as:  ANTIVERT   metoCLOPramide 10 MG tablet Commonly known as:  REGLAN   potassium chloride SA 20 MEQ tablet Commonly known as:  K-DUR,KLOR-CON     TAKE these medications     Indication  hydrOXYzine 25 MG tablet Commonly known as:  ATARAX/VISTARIL Take 1 tablet (25 mg total) by mouth 3 (three) times daily as needed for anxiety.  Indication:  Feeling Anxious   levothyroxine 125 MCG tablet Commonly known as:  SYNTHROID, LEVOTHROID Take 125 mcg by mouth  daily before breakfast. What changed:  Another medication with the same name was removed. Continue taking this medication, and follow the directions you see here.  Indication:  Underactive Thyroid   sertraline 50 MG tablet Commonly known as:  ZOLOFT Take 1 tablet (50 mg total) by mouth daily. For mood Start taking on:  May 14, 2018  Indication:  Mood   traZODone 50 MG tablet Commonly known as:  DESYREL Take 1 tablet (50 mg total) by mouth at bedtime as needed for sleep.  Indication:  Trouble Sleeping      Follow-up Information    [facility] Follow up on 05/20/2018.   Why:  Your next medication management appointment with Corrie Dandy is Wednesday, 2/19 at 10:00a. Please bring current medications and discharge paperwork from this hospitalization.  Your first therapy appointment with Susy Frizzle is Friday, 2/28 at 12:00p.  Contact information: [address] [place] [address] [phone]           Follow-up recommendations: Activity as tolerated. Diet as recommended by primary care physician. Keep all scheduled follow-up appointments as recommended.   Comments:   Patient is instructed to take all prescribed medications as recommended. Report any side effects or adverse reactions to your outpatient psychiatrist. Patient is instructed to abstain from alcohol and illegal drugs while on prescription medications. In the event of worsening symptoms, patient is instructed to call the crisis hotline, 911, or go to the nearest emergency department for evaluation and treatment.  Signed: Aldean Baker, NP 05/13/2018, 2:27 PM

## 2018-05-13 NOTE — ED Triage Notes (Signed)
Pt reports L ankle pain after stepping off a curb wrong. States he took OTC meds with no relief.

## 2018-05-14 ENCOUNTER — Other Ambulatory Visit: Payer: Self-pay

## 2018-05-14 ENCOUNTER — Emergency Department (HOSPITAL_COMMUNITY): Payer: Self-pay

## 2018-05-14 ENCOUNTER — Encounter (HOSPITAL_COMMUNITY): Payer: Self-pay | Admitting: Emergency Medicine

## 2018-05-14 ENCOUNTER — Emergency Department (HOSPITAL_COMMUNITY)
Admission: EM | Admit: 2018-05-14 | Discharge: 2018-05-15 | Disposition: A | Discharge status: Home / Self Care | Payer: Self-pay | Attending: Emergency Medicine | Admitting: Emergency Medicine

## 2018-05-14 ENCOUNTER — Encounter (HOSPITAL_COMMUNITY): Payer: Self-pay | Admitting: *Deleted

## 2018-05-14 DIAGNOSIS — R002 Palpitations: Secondary | ICD-10-CM

## 2018-05-14 DIAGNOSIS — Z720 Tobacco use: Secondary | ICD-10-CM

## 2018-05-14 DIAGNOSIS — F419 Anxiety disorder, unspecified: Secondary | ICD-10-CM | POA: Insufficient documentation

## 2018-05-14 DIAGNOSIS — R079 Chest pain, unspecified: Secondary | ICD-10-CM

## 2018-05-14 DIAGNOSIS — F17210 Nicotine dependence, cigarettes, uncomplicated: Secondary | ICD-10-CM | POA: Insufficient documentation

## 2018-05-14 LAB — RAPID URINE DRUG SCREEN, HOSP PERFORMED
Amphetamines: NOT DETECTED
Barbiturates: NOT DETECTED
Benzodiazepines: NOT DETECTED
Cocaine: NOT DETECTED
Opiates: NOT DETECTED
Tetrahydrocannabinol: POSITIVE — AB

## 2018-05-14 LAB — CBC
HCT: 45.1 % (ref 39.0–52.0)
HEMOGLOBIN: 14.7 g/dL (ref 13.0–17.0)
MCH: 32.5 pg (ref 26.0–34.0)
MCHC: 32.6 g/dL (ref 30.0–36.0)
MCV: 99.8 fL (ref 80.0–100.0)
Platelets: 273 10*3/uL (ref 150–400)
RBC: 4.52 MIL/uL (ref 4.22–5.81)
RDW: 14.7 % (ref 11.5–15.5)
WBC: 11.4 10*3/uL — ABNORMAL HIGH (ref 4.0–10.5)
nRBC: 0 % (ref 0.0–0.2)

## 2018-05-14 LAB — CBG MONITORING, ED: Glucose-Capillary: 100 mg/dL — ABNORMAL HIGH (ref 70–99)

## 2018-05-14 LAB — BASIC METABOLIC PANEL
Anion gap: 8 (ref 5–15)
BUN: 15 mg/dL (ref 6–20)
CO2: 25 mmol/L (ref 22–32)
Calcium: 8.6 mg/dL — ABNORMAL LOW (ref 8.9–10.3)
Chloride: 106 mmol/L (ref 98–111)
Creatinine, Ser: 1 mg/dL (ref 0.61–1.24)
GFR calc Af Amer: >60 mL/min (ref >60)
GFR calc non Af Amer: >60 mL/min (ref >60)
Glucose, Bld: 99 mg/dL (ref 70–99)
Potassium: 3.7 mmol/L (ref 3.5–5.1)
Sodium: 139 mmol/L (ref 135–145)

## 2018-05-14 LAB — I-STAT TROPONIN, ED: Troponin i, poc: 0 ng/mL (ref 0.00–0.08)

## 2018-05-14 MED ORDER — LIDOCAINE 5 % EX PTCH: 1.0000 | MEDICATED_PATCH | CUTANEOUS | 0 refills | Status: DC

## 2018-05-14 MED ORDER — NAPROXEN 250 MG PO TABS
500.0000 mg | ORAL_TABLET | ORAL | Status: AC
  Administered 2018-05-14: 500 mg via ORAL
  Filled 2018-05-14: qty 2

## 2018-05-14 MED ORDER — HYDROXYZINE HCL 25 MG PO TABS
25.0000 mg | ORAL_TABLET | Freq: Once | ORAL | Status: AC
  Administered 2018-05-14: 25 mg via ORAL
  Filled 2018-05-14: qty 1

## 2018-05-14 MED ORDER — ACETAMINOPHEN 500 MG PO TABS
1000.0000 mg | ORAL_TABLET | Freq: Once | ORAL | Status: AC
  Administered 2018-05-14: 1000 mg via ORAL
  Filled 2018-05-14: qty 2

## 2018-05-14 MED ORDER — LIDOCAINE 5 % EX PTCH
1.0000 | MEDICATED_PATCH | CUTANEOUS | Status: DC
  Administered 2018-05-14: 1 via TRANSDERMAL
  Filled 2018-05-14: qty 1

## 2018-05-14 NOTE — ED Notes (Signed)
Patient verbalizes understanding of discharge instructions. Opportunity for questioning and answers were provided. Armband removed by staff, pt discharged from ED in wheelchair.  

## 2018-05-14 NOTE — ED Provider Notes (Addendum)
[facility]-EMERGENCY DEPT Provider Note   CSN: [id number] Arrival date & time: 05/14/18  2002     History   Chief Complaint Chief Complaint  Patient presents with  . Anxiety    HPI Randy KohlerJames Webster is a 43 y.o. male.  HPI  Patient is a 43 year old male with a history of anxiety, depression, and hypothyroidism presenting for intermittent chest pain, shortness breath, palpitations.  He is describing these as "anxiety attacks".  [facility].  Patient reports that he does have his history of "anxiety attacks", and they can be different every time they come.  He reports that the current episode began at 5:30 PM when he was sitting watching TV and he "suddenly felt anxious".  He reports that the sensation made him begin to breathe quickly and lasted a minute or 2.  He has had approximately 5 discrete different episodes since then.  He denies any lower extremity edema or calf tenderness.  He denies any history of DVT/PE, hormone use, cancer treatment, recent medical hospitalization, recent surgery, or long travel.  Patient denies any history of primary cardiovascular disease.  He reports that many years ago he was hospitalized at a outside hospital in another state for "with a thought was a massive heart attack", but reports that he was ultimately diagnosed as anxiety.  He does not have any primary cardiovascular disease.  No history of hypertension.  He smokes daily approximately half a pack a day and is trying to quit.  Denies any cocaine or methamphetamine but does report marijuana use.  Patient reports that his mother died of a massive stroke at age 41 and his maternal grandmother had a MI in her early 3360s.  He reports that he has been on Zoloft and hydroxyzine for a couple days, reports that he does not think the hydroxyzine works very well for him.  He has upcoming appointments with psychiatry in 10 days.  Past Medical History:  Diagnosis Date   . Anxiety   . Depression   . Thyroid disease     Patient Active Problem List   Diagnosis Date Noted  . MDD (major depressive disorder), recurrent episode, severe (HCC) 05/09/2018    History reviewed. No pertinent surgical history.      Home Medications    Prior to Admission medications   Medication Sig Start Date End Date Taking? Authorizing Provider  hydrOXYzine (ATARAX/VISTARIL) 25 MG tablet Take 1 tablet (25 mg total) by mouth 3 (three) times daily as needed for anxiety. 05/13/18  Yes Aldean BakerSykes, Janet E, NP  levothyroxine (SYNTHROID, LEVOTHROID) 125 MCG tablet Take 125 mcg by mouth daily before breakfast.   Yes [provider]  lidocaine (LIDODERM) 5 % Place 1 patch onto the skin daily. Remove & Discard patch within 12 hours or as directed by MD 05/14/18  Yes Palumbo, April, MD  sertraline (ZOLOFT) 50 MG tablet Take 1 tablet (50 mg total) by mouth daily. For mood 05/14/18  Yes Aldean BakerSykes, Janet E, NP  traZODone (DESYREL) 50 MG tablet Take 1 tablet (50 mg total) by mouth at bedtime as needed for sleep. 05/13/18  Yes Aldean BakerSykes, Janet E, NP    Family History No family history on file.  Social History Social History   Tobacco Use  . Smoking status: Current Every Day Smoker    Packs/day: 0.50    Types: Cigarettes  . Smokeless tobacco: Never Used  Substance Use Topics  . Alcohol use: Not Currently  .  Drug use: No     Allergies   Patient has no known allergies.   Review of Systems Review of Systems  Constitutional: Negative for chills and fever.  HENT: Negative for congestion and sore throat.   Eyes: Negative for visual disturbance.  Respiratory: Positive for chest tightness and shortness of breath. Negative for cough.   Cardiovascular: Positive for palpitations. Negative for chest pain and leg swelling.  Gastrointestinal: Negative for abdominal pain, nausea and vomiting.  Genitourinary: Negative for dysuria and flank pain.  Musculoskeletal: Negative for back pain and  myalgias.  Skin: Negative for rash.  Neurological: Negative for dizziness, syncope, light-headedness and headaches.  Psychiatric/Behavioral: The patient is nervous/anxious.      Physical Exam Updated Vital Signs BP (!) 143/98 (BP Location: Left Arm)   Pulse 73   Temp 98.7 F (37.1 C) (Oral)   Resp 17   Ht 5\' 11"  (1.803 m)   Wt 83.9 kg   SpO2 98%   BMI 25.80 kg/m   Physical Exam Vitals signs and nursing note reviewed.  Constitutional:      General: He is not in acute distress.    Appearance: He is well-developed. He is not diaphoretic.  HENT:     Head: Normocephalic and atraumatic.  Eyes:     Conjunctiva/sclera: Conjunctivae normal.     Pupils: Pupils are equal, round, and reactive to light.  Neck:     Musculoskeletal: Normal range of motion and neck supple.  Cardiovascular:     Rate and Rhythm: Normal rate and regular rhythm.     Pulses:          Radial pulses are 2+ on the right side and 2+ on the left side.       Dorsalis pedis pulses are 2+ on the right side and 2+ on the left side.     Heart sounds: S1 normal and S2 normal. No murmur.     Comments: No lower extremity edema.  No calf tenderness. Pulmonary:     Effort: Pulmonary effort is normal.     Breath sounds: Normal breath sounds. No wheezing or rales.  Abdominal:     General: There is no distension.     Palpations: Abdomen is soft.     Tenderness: There is no abdominal tenderness. There is no guarding.  Musculoskeletal: Normal range of motion.        General: No deformity.  Lymphadenopathy:     Cervical: No cervical adenopathy.  Skin:    General: Skin is warm and dry.     Findings: No erythema or rash.  Neurological:     Mental Status: He is alert.     Comments: Cranial nerves grossly intact. Patient moves extremities symmetrically and with good coordination.  Psychiatric:        Behavior: Behavior normal.        Thought Content: Thought content normal.        Judgment: Judgment normal.      Comments: Normal affect.  He is not anxious appearing on exam.      ED Treatments / Results  Labs (all labs ordered are listed, but only abnormal results are displayed) Labs Reviewed  BASIC METABOLIC PANEL  CBC  RAPID URINE DRUG SCREEN, HOSP PERFORMED  CBG MONITORING, ED  I-STAT TROPONIN, ED    EKG EKG Interpretation  Date/Time:  Thursday May 14 2018 23:10:14 EST Ventricular Rate:  71 PR Interval:    QRS Duration: 98 QT Interval:  409 QTC Calculation: 445  R Axis:   37 Text Interpretation:  Sinus rhythm Normal ECG Confirmed by Gilda Crease [phone]) on 05/14/2018 11:52:34 PM   Radiology Dg Ankle Complete Left  Result Date: 05/14/2018 CLINICAL DATA:  Twisting injury.  Anterior and medial pain EXAM: LEFT ANKLE COMPLETE - 3+ VIEW COMPARISON:  None. FINDINGS: There is no evidence of fracture, dislocation, or joint effusion. There is no evidence of arthropathy or other focal bone abnormality. Soft tissues are unremarkable. IMPRESSION: Negative. Electronically Signed   By: Charlett Nose M.D.   On: 05/14/2018 00:09    Procedures Procedures (including critical care time)  Medications Ordered in ED Medications  hydrOXYzine (ATARAX/VISTARIL) tablet 25 mg (has no administration in time range)     Initial Impression / Assessment and Plan / ED Course  I have reviewed the triage vital signs and the nursing notes.  Pertinent labs & imaging results that were available during my care of the patient were reviewed by me and considered in my medical decision making (see chart for details).  Clinical Course as of May 16 47  Fri May 15, 2018  0041 Plan: Patient labs initially reassuring.  Initial troponin is negative and EKG is without ischemia.  Patient will need delta troponin.   [HM]  0041 Slightly elevated.  WBC(!): 11.4 [HM]  0041 No evidence of pneumothorax, pulmonary edema or pneumonia.  DG Chest 2 View [HM]  0042 Mild hypertension  BP(!): 143/98 [HM]      Clinical Course User Index [HM] Muthersbaugh, Dahlia Client, PA-C     Differential diagnosis includes ACS, PE, thoracic aortic dissection, Boerhaave's syndrome, cardiac tamponade, pneumothorax, incarcerated diaphragmatic hernia, cholecystitis, esophageal spasm, gastroesophageal reflux, herpes zoster of the thorax, pericarditis, pneumonia, chest wall pain, costochondritis, anxiety.   Doubt ACS, as delta troponin is negative, initial and repeat EKGs show no signs of ischemia, infarction, or arrhythmia, and HEART score 2(age, risk factors). Doubt PE as Well's score 0 and PERC negative, and patient not tachycardic, hypoxic, or tachypneic. Doubt TAD by hx, CXR showed no widening mediastinum, and pulses equal in all extremities. Patient remained nontoxic appearing and in no acute distress during emergency department course. Vital signs stable in the emergency department. Therefore, doubt esophageal rupture, cardiac tamponade, or pneumothorax. Pericarditis less likely due to no preceding infectious symptoms and pain not improved in upright positions.  Abnormal labs include slight leukocytosis, possible stress demargination.  Given the heart score, feel that patient if negative delta troponin can be followed by cardiology as an outpatient for Holter monitoring given that his symptoms appeared occur with palpitations.  Anxiety possible, and patient was given information to follow-up with [facility] should he experience worsening mood, instructed that he can walk-in at [facility].  Care signed out to [facility], PA-C to await delta troponin and reassessment.  Final Clinical Impressions(s) / ED Diagnoses   Final diagnoses:  Central chest pain  Palpitations  Tobacco use    ED Discharge Orders         Ordered    Ambulatory referral to Cardiology     05/15/18 0006             Delia Chimes 05/15/18 0050    Arby Barrette, MD 05/22/18 1049

## 2018-05-14 NOTE — ED Provider Notes (Signed)
Care assumed from [facility] [place].  Please see her full H&P.  In short,  Randy Webster is a 43 y.o. male presents for chest pain which he is describing as an anxiety attack.  He was just discharged from [facility] 2 days ago.  This episode began around 5:30 PM.  He reports chest pain and shortness of breath.  Patient is a smoker.  His mother had a massive stroke at the age of 35 and his maternal grandmother had an MI in her early 39s.  Initial provider reports that patient has chest pain at rest without associated physical symptoms of anxiety including no dyspnea, tachycardia.  Physical Exam  BP (!) 143/98 (BP Location: Left Arm)   Pulse 73   Temp 98.7 F (37.1 C) (Oral)   Resp 17   Ht 5\' 11"  (1.803 m)   Wt 83.9 kg   SpO2 98%   BMI 25.80 kg/m   Physical Exam Vitals signs and nursing note reviewed.  Constitutional:      General: He is not in acute distress.    Appearance: He is well-developed.  HENT:     Head: Normocephalic.  Eyes:     General: No scleral icterus.    Conjunctiva/sclera: Conjunctivae normal.  Neck:     Musculoskeletal: Normal range of motion.  Cardiovascular:     Rate and Rhythm: Normal rate.  Pulmonary:     Effort: Pulmonary effort is normal.  Musculoskeletal: Normal range of motion.  Skin:    General: Skin is warm and dry.  Neurological:     Mental Status: He is alert.     ED Course/Procedures   Clinical Course as of May 15 300  Fri May 15, 2018  0041 Plan: Patient labs initially reassuring.  Initial troponin is negative and EKG is without ischemia.  Patient will need delta troponin.   [HM]  0041 Slightly elevated.  WBC(!): 11.4 [HM]  0041 No evidence of pneumothorax, pulmonary edema or pneumonia.  DG Chest 2 View [HM]  0042 Mild hypertension  BP(!): 143/98 [HM]  0233 Repeat troponin remains negative  Troponin i, poc: 0.01 [HM]  0233 No cocaine usage  COCAINE: NONE DETECTED [HM]  0302 Hypertension improved without intervention.  BP: 138/88 [HM]     Clinical Course User Index [HM] Linsy Ehresman, Boyd Kerbs    Procedures   Results for orders placed or performed during the hospital encounter of 05/14/18  Basic metabolic panel  Result Value Ref Range   Sodium 139 135 - 145 mmol/L   Potassium 3.7 3.5 - 5.1 mmol/L   Chloride 106 98 - 111 mmol/L   CO2 25 22 - 32 mmol/L   Glucose, Bld 99 70 - 99 mg/dL   BUN 15 6 - 20 mg/dL   Creatinine, Ser 1.73 0.61 - 1.24 mg/dL   Calcium 8.6 (L) 8.9 - 10.3 mg/dL   GFR calc non Af Amer >60 >60 mL/min   GFR calc Af Amer >60 >60 mL/min   Anion gap 8 5 - 15  CBC  Result Value Ref Range   WBC 11.4 (H) 4.0 - 10.5 K/uL   RBC 4.52 4.22 - 5.81 MIL/uL   Hemoglobin 14.7 13.0 - 17.0 g/dL   HCT 56.7 01.4 - 10.3 %   MCV 99.8 80.0 - 100.0 fL   MCH 32.5 26.0 - 34.0 pg   MCHC 32.6 30.0 - 36.0 g/dL   RDW 01.3 14.3 - 88.8 %   Platelets 273 150 - 400 K/uL  nRBC 0.0 0.0 - 0.2 %  Rapid urine drug screen (hospital performed)  Result Value Ref Range   Opiates NONE DETECTED NONE DETECTED   Cocaine NONE DETECTED NONE DETECTED   Benzodiazepines NONE DETECTED NONE DETECTED   Amphetamines NONE DETECTED NONE DETECTED   Tetrahydrocannabinol POSITIVE (A) NONE DETECTED   Barbiturates NONE DETECTED NONE DETECTED  CBG monitoring, ED  Result Value Ref Range   Glucose-Capillary 100 (H) 70 - 99 mg/dL  I-stat troponin, ED (0, 3)  Result Value Ref Range   Troponin i, poc 0.00 0.00 - 0.08 ng/mL   Comment 3          I-stat troponin, ED (0, 3)  Result Value Ref Range   Troponin i, poc 0.01 0.00 - 0.08 ng/mL   Comment 3           Dg Chest 2 View  Result Date: 05/14/2018 CLINICAL DATA:  Chest pain EXAM: CHEST - 2 VIEW COMPARISON:  12/14/2017 FINDINGS: Heart and mediastinal contours are within normal limits. No focal opacities or effusions. No acute bony abnormality. IMPRESSION: No active cardiopulmonary disease. Electronically Signed   By: Charlett NoseKevin  Dover M.D.   On: 05/14/2018 23:32     EKG  Interpretation  Date/Time:  Thursday May 14 2018 23:10:14 EST Ventricular Rate:  71 PR Interval:    QRS Duration: 98 QT Interval:  409 QTC Calculation: 445 R Axis:   37 Text Interpretation:  Sinus rhythm Normal ECG Confirmed by Gilda CreasePollina, Christopher J [phone]([id number]) on 05/14/2018 11:52:34 PM        MDM   Patient presents with chest pain described as anxiety.  Central nonradiating.  Labs are reassuring.  Mild leukocytosis but patient is afebrile without tachycardia. PERC negative and HEART score of 1.  Patient is low risk for ACS.  Initial and delta troponin are negative.  EKG is nonischemic.  Patient is noted to be mildly hypertensive on arrival.  He is without tachycardia or fever.  No hypoxia.  Chest x-ray without acute abnormality including pulmonary edema, Boerhaave's, widened mediastinum, cardiomegaly, pneumonia or pneumothorax.  Patient is chest pain-free at this time.  At this time chest pain is less likely to be cardiac in nature.  Patient will be discharged home with close cardiology follow-up.  Discussed reasons return immediately to the emergency department.  Patient states understanding and is in agreement with the plan.  BP 138/88 (BP Location: Right Arm)   Pulse 74   Temp 98.1 F (36.7 C) (Oral)   Resp 16   Ht 5\' 11"  (1.803 m)   Wt 83.9 kg   SpO2 100%   BMI 25.80 kg/m     Central chest pain  Palpitations  Tobacco use      Milta DeitersMuthersbaugh, Karlo Goeden, PA-C 05/15/18 0302    Sabas SousBero, Michael M, MD 05/17/18 1531

## 2018-05-14 NOTE — ED Provider Notes (Signed)
[name] EMERGENCY DEPARTMENT Provider Note   CSN: [id number] Arrival date & time: 05/13/18  2254     History   Chief Complaint Chief Complaint  Patient presents with  . Ankle Pain    HPI Randy Webster is a 43 y.o. male.  The history is provided by the patient.  Ankle Pain  Location:  Ankle Injury: yes   Mechanism of injury comment:  Step off a curb and twisted Ankle location:  L ankle Pain details:    Quality:  Aching   Radiates to:  Does not radiate   Severity:  Severe   Onset quality:  Sudden   Timing:  Constant   Progression:  Unchanged Chronicity:  New Dislocation: no   Foreign body present:  No foreign bodies Prior injury to area:  No Relieved by:  Nothing Worsened by:  Nothing Ineffective treatments:  NSAIDs Associated symptoms: no back pain   Risk factors: no concern for non-accidental trauma     Past Medical History:  Diagnosis Date  . Anxiety   . Depression   . Thyroid disease     Patient Active Problem List   Diagnosis Date Noted  . MDD (major depressive disorder), recurrent episode, severe (HCC) 05/09/2018    History reviewed. No pertinent surgical history.      Home Medications    Prior to Admission medications   Medication Sig Start Date End Date Taking? Authorizing Provider  hydrOXYzine (ATARAX/VISTARIL) 25 MG tablet Take 1 tablet (25 mg total) by mouth 3 (three) times daily as needed for anxiety. 05/13/18   Aldean Baker, NP  levothyroxine (SYNTHROID, LEVOTHROID) 125 MCG tablet Take 125 mcg by mouth daily before breakfast.    [provider]  sertraline (ZOLOFT) 50 MG tablet Take 1 tablet (50 mg total) by mouth daily. For mood 05/14/18   Aldean Baker, NP  traZODone (DESYREL) 50 MG tablet Take 1 tablet (50 mg total) by mouth at bedtime as needed for sleep. 05/13/18   Aldean Baker, NP    Family History No family history on file.  Social History Social History   Tobacco Use  . Smoking status: Current  Every Day Smoker    Packs/day: 0.50    Types: Cigarettes  . Smokeless tobacco: Never Used  Substance Use Topics  . Alcohol use: Not Currently  . Drug use: No     Allergies   Patient has no known allergies.   Review of Systems Review of Systems  Musculoskeletal: Positive for arthralgias. Negative for back pain.  All other systems reviewed and are negative.    Physical Exam Updated Vital Signs BP 133/84 (BP Location: Right Arm)   Pulse 97   Temp 98.5 F (36.9 C)   Resp 16   SpO2 97%   Physical Exam Vitals signs and nursing note reviewed.  Constitutional:      Appearance: Normal appearance. He is not ill-appearing or diaphoretic.  HENT:     Head: Normocephalic and atraumatic.     Nose: Nose normal.     Mouth/Throat:     Mouth: Mucous membranes are moist.     Pharynx: Oropharynx is clear.  Eyes:     Conjunctiva/sclera: Conjunctivae normal.     Pupils: Pupils are equal, round, and reactive to light.  Neck:     Musculoskeletal: Normal range of motion and neck supple.  Cardiovascular:     Rate and Rhythm: Normal rate and regular rhythm.     Pulses: Normal pulses.  Heart sounds: Normal heart sounds.  Pulmonary:     Effort: Pulmonary effort is normal.     Breath sounds: Normal breath sounds.  Abdominal:     General: Abdomen is flat. Bowel sounds are normal.     Tenderness: There is no abdominal tenderness. There is no guarding.  Musculoskeletal: Normal range of motion.     Left ankle: Normal. Achilles tendon normal.     Left foot: Normal.  Skin:    General: Skin is warm and dry.     Capillary Refill: Capillary refill takes less than 2 seconds.  Neurological:     General: No focal deficit present.     Mental Status: He is alert.  Psychiatric:        Mood and Affect: Mood normal.      ED Treatments / Results  Labs (all labs ordered are listed, but only abnormal results are displayed) Labs Reviewed - No data to display  EKG None  Radiology Dg  Ankle Complete Left  Result Date: 05/14/2018 CLINICAL DATA:  Twisting injury.  Anterior and medial pain EXAM: LEFT ANKLE COMPLETE - 3+ VIEW COMPARISON:  None. FINDINGS: There is no evidence of fracture, dislocation, or joint effusion. There is no evidence of arthropathy or other focal bone abnormality. Soft tissues are unremarkable. IMPRESSION: Negative. Electronically Signed   By: Charlett NoseKevin  Dover M.D.   On: 05/14/2018 00:09    Procedures Procedures (including critical care time)  Medications Ordered in ED Medications  lidocaine (LIDODERM) 5 % 1 patch (has no administration in time range)  acetaminophen (TYLENOL) tablet 1,000 mg (has no administration in time range)  naproxen (NAPROSYN) tablet 500 mg (has no administration in time range)      Final Clinical Impressions(s) / ED Diagnoses   Return for pain, intractable cough, fevers >100.4 unrelieved by medication, shortness of breath, intractable vomiting, chest pain, shortness of breath, weakness numbness, changes in speech, facial asymmetry,abdominal pain, passing out,Inability to tolerate liquids or food, cough, altered mental status or any concerns. No signs of systemic illness or infection. The patient is nontoxic-appearing on exam and vital signs are within normal limits.   I have reviewed the triage vital signs and the nursing notes. Pertinent labs &imaging results that were available during my care of the patient were reviewed by me and considered in my medical decision making (see chart for details).  After history, exam, and medical workup I feel the patient has been appropriately medically screened and is safe for discharge home. Pertinent diagnoses were discussed with the patient. Patient was given return precautions.   Rohen Kimes, MD 05/14/18 [id number]

## 2018-05-14 NOTE — ED Notes (Signed)
RN gave patient dry socks per patient request.  Patient requesting food at this time.

## 2018-05-14 NOTE — ED Triage Notes (Signed)
Released yesterday from [facility] yesterday and started on Vistaril and Zoloft, tonight about 1730 sudden anxiety attack. Denies SI/HI

## 2018-05-14 NOTE — ED Notes (Signed)
Ice pack applied to L ankle

## 2018-05-15 LAB — I-STAT TROPONIN, ED: Troponin i, poc: 0.01 ng/mL (ref 0.00–0.08)

## 2018-05-15 NOTE — Discharge Instructions (Addendum)
Please read and follow all provided instructions.  Your diagnoses today include:  1. Central chest pain   2. Palpitations     Tests performed today include: An EKG of your heart A chest x-ray Cardiac enzymes - a blood test for heart muscle damage Blood counts and electrolytes Vital signs. See below for your results today.   Medications prescribed:   Take any prescribed medications only as directed.  Please continue-year-old medications.  Follow-up instructions: Please follow-up with your primary care provider as soon as you can for further evaluation of your symptoms.   Return instructions:  SEEK IMMEDIATE MEDICAL ATTENTION IF: You have severe chest pain, especially if the pain is crushing or pressure-like and spreads to the arms, back, neck, or jaw, or if you have sweating, nausea (feeling sick to your stomach), or shortness of breath. THIS IS AN EMERGENCY. Don't wait to see if the pain will go away. Get medical help at once. Call 911 or 0 (operator). DO NOT drive yourself to the hospital.  Your chest pain gets worse and does not go away with rest.  You have an attack of chest pain lasting longer than usual, despite rest and treatment with the medications your caregiver has prescribed.  You wake from sleep with chest pain or shortness of breath. You feel dizzy or faint. You have chest pain not typical of your usual pain for which you originally saw your caregiver.  You have any other emergent concerns regarding your health.  Additional Information: Chest pain comes from many different causes. Your caregiver has diagnosed you as having chest pain that is not specific for one problem, but does not require admission.  You are at low risk for an acute heart condition or other serious illness.   Please follow-up with your primary care provider to discuss smoking cessation.  Please continue to decrease amount of cigarettes that you are smoking per day.  Your vital signs today  were: BP (!) 143/98 (BP Location: Left Arm)    Pulse 73    Temp 98.7 F (37.1 C) (Oral)    Resp 17    Ht 5\' 11"  (1.803 m)    Wt 83.9 kg    SpO2 98%    BMI 25.80 kg/m  If your blood pressure (BP) was elevated above 135/85 this visit, please have this repeated by your doctor within one month. --------------

## 2018-05-17 ENCOUNTER — Emergency Department (HOSPITAL_COMMUNITY)
Admission: EM | Admit: 2018-05-17 | Discharge: 2018-05-17 | Disposition: A | Discharge status: Home / Self Care | Payer: Self-pay | Attending: Emergency Medicine | Admitting: Emergency Medicine

## 2018-05-17 ENCOUNTER — Encounter (HOSPITAL_COMMUNITY): Payer: Self-pay | Admitting: Emergency Medicine

## 2018-05-17 ENCOUNTER — Other Ambulatory Visit: Payer: Self-pay

## 2018-05-17 DIAGNOSIS — Z79899 Other long term (current) drug therapy: Secondary | ICD-10-CM | POA: Insufficient documentation

## 2018-05-17 DIAGNOSIS — G43009 Migraine without aura, not intractable, without status migrainosus: Secondary | ICD-10-CM | POA: Insufficient documentation

## 2018-05-17 DIAGNOSIS — F17210 Nicotine dependence, cigarettes, uncomplicated: Secondary | ICD-10-CM | POA: Insufficient documentation

## 2018-05-17 LAB — URINALYSIS, ROUTINE W REFLEX MICROSCOPIC
Bacteria, UA: NONE SEEN
Bilirubin Urine: NEGATIVE
Glucose, UA: NEGATIVE mg/dL
Ketones, ur: NEGATIVE mg/dL
Leukocytes,Ua: NEGATIVE
Nitrite: NEGATIVE
Protein, ur: NEGATIVE mg/dL
Specific Gravity, Urine: 1.012 (ref 1.005–1.030)
pH: 7 (ref 5.0–8.0)

## 2018-05-17 LAB — COMPREHENSIVE METABOLIC PANEL
ALT: 22 U/L (ref 0–44)
AST: 17 U/L (ref 15–41)
Albumin: 3.9 g/dL (ref 3.5–5.0)
Alkaline Phosphatase: 53 U/L (ref 38–126)
Anion gap: 6 (ref 5–15)
BUN: 16 mg/dL (ref 6–20)
CO2: 27 mmol/L (ref 22–32)
Calcium: 9.2 mg/dL (ref 8.9–10.3)
Chloride: 105 mmol/L (ref 98–111)
Creatinine, Ser: 0.92 mg/dL (ref 0.61–1.24)
GFR calc Af Amer: >60 mL/min (ref >60)
GFR calc non Af Amer: >60 mL/min (ref >60)
Glucose, Bld: 92 mg/dL (ref 70–99)
Potassium: 3.7 mmol/L (ref 3.5–5.1)
Sodium: 138 mmol/L (ref 135–145)
Total Bilirubin: 0.4 mg/dL (ref 0.3–1.2)
Total Protein: 6.3 g/dL — ABNORMAL LOW (ref 6.5–8.1)

## 2018-05-17 LAB — CBC
HCT: 43.8 % (ref 39.0–52.0)
Hemoglobin: 14.3 g/dL (ref 13.0–17.0)
MCH: 32.5 pg (ref 26.0–34.0)
MCHC: 32.6 g/dL (ref 30.0–36.0)
MCV: 99.5 fL (ref 80.0–100.0)
Platelets: 244 10*3/uL (ref 150–400)
RBC: 4.4 MIL/uL (ref 4.22–5.81)
RDW: 14.4 % (ref 11.5–15.5)
WBC: 11.5 10*3/uL — ABNORMAL HIGH (ref 4.0–10.5)
nRBC: 0 % (ref 0.0–0.2)

## 2018-05-17 LAB — LIPASE, BLOOD: Lipase: 43 U/L (ref 11–51)

## 2018-05-17 MED ORDER — SODIUM CHLORIDE 0.9% FLUSH: 3.0000 mL | Freq: Once | INTRAVENOUS | Status: DC

## 2018-05-17 MED ORDER — SODIUM CHLORIDE 0.9 % IV BOLUS
1000.0000 mL | Freq: Once | INTRAVENOUS | Status: AC
  Administered 2018-05-17: 1000 mL via INTRAVENOUS

## 2018-05-17 MED ORDER — DIPHENHYDRAMINE HCL 50 MG/ML IJ SOLN
12.5000 mg | Freq: Once | INTRAMUSCULAR | Status: AC
  Administered 2018-05-17: 12.5 mg via INTRAVENOUS
  Filled 2018-05-17: qty 1

## 2018-05-17 MED ORDER — ONDANSETRON 4 MG PO TBDP
4.0000 mg | ORAL_TABLET | Freq: Once | ORAL | Status: AC | PRN
  Administered 2018-05-17: 4 mg via ORAL
  Filled 2018-05-17: qty 1

## 2018-05-17 MED ORDER — KETOROLAC TROMETHAMINE 15 MG/ML IJ SOLN
15.0000 mg | Freq: Once | INTRAMUSCULAR | Status: AC
  Administered 2018-05-17: 15 mg via INTRAVENOUS
  Filled 2018-05-17: qty 1

## 2018-05-17 MED ORDER — METOCLOPRAMIDE HCL 5 MG/ML IJ SOLN
10.0000 mg | Freq: Once | INTRAMUSCULAR | Status: AC
  Administered 2018-05-17: 10 mg via INTRAVENOUS
  Filled 2018-05-17: qty 2

## 2018-05-17 NOTE — ED Notes (Signed)
Patient given a sandwich and PO fluids per PA. Patient states he doesn't have a lot of food at home and what he does have, he lets hid gf eat.

## 2018-05-17 NOTE — ED Provider Notes (Signed)
[facility]-EMERGENCY DEPT Provider Note   CSN: [id number] Arrival date & time: 05/17/18  1937  History   Chief Complaint Chief Complaint  Patient presents with  . Migraine  . Nausea    HPI Vella KohlerJames Colonna is a 43 y.o. male with past medical history significant for homelessness, anxiety, depression, recurrent migraine presents for evaluation of migraine.  Patient states he has had a headache began this morning at around 8 AM.  States began gradually.  Denies sudden onset thunderclap headache.  Rates his current pain a 7/10.  Pain does not radiate.  Pain is located to his right temporal region.  Patient states he has had increased stressors including his significant other recently being mated to behavioral health for suicidal ideation as well as patient recently being released from behavioral health for recurrent depression.  Patient states his depression symptoms and anxiety have improved since he began taking Zoloft and Atarax.  Denies current SI, HI, AVH.  Denies fever, chills, emesis, denies, lightheadedness, slurred speech, facial asymmetry, neck pain, neck stiffness, eye pain, vision changes, congestion, rhinorrhea, cough, chest pain, shortness of breath, abdominal pain diarrhea.  She states he has had photophobia, phonophobia as well as nausea since his began his symptoms.  Patient states he did have chills earlier today, however states he is homeless and this may have been secondary to him being outside.  Patient states his current headache feels similar to his previous headaches.  Denies head injury or head trauma.  History obtained from patient.  No interpreter was used.  HPI  Past Medical History:  Diagnosis Date  . Anxiety   . Depression   . Thyroid disease     Patient Active Problem List   Diagnosis Date Noted  . MDD (major depressive disorder), recurrent episode, severe (HCC) 05/09/2018    History reviewed. No pertinent surgical history.      Home  Medications    Prior to Admission medications   Medication Sig Start Date End Date Taking? Authorizing Provider  hydrOXYzine (ATARAX/VISTARIL) 25 MG tablet Take 1 tablet (25 mg total) by mouth 3 (three) times daily as needed for anxiety. 05/13/18  Yes Aldean BakerSykes, Janet E, NP  ibuprofen (ADVIL,MOTRIN) 200 MG tablet Take 800 mg by mouth daily as needed for moderate pain.   Yes [provider]  levothyroxine (SYNTHROID, LEVOTHROID) 125 MCG tablet Take 125 mcg by mouth daily before breakfast.   Yes [provider]  sertraline (ZOLOFT) 50 MG tablet Take 1 tablet (50 mg total) by mouth daily. For mood 05/14/18  Yes Aldean BakerSykes, Janet E, NP  traZODone (DESYREL) 50 MG tablet Take 1 tablet (50 mg total) by mouth at bedtime as needed for sleep. 05/13/18  Yes Aldean BakerSykes, Janet E, NP  lidocaine (LIDODERM) 5 % Place 1 patch onto the skin daily. Remove & Discard patch within 12 hours or as directed by MD Patient not taking: Reported on 05/17/2018 05/14/18   Nicanor AlconPalumbo, April, MD    Family History History reviewed. No pertinent family history.  Social History Social History   Tobacco Use  . Smoking status: Current Every Day Smoker    Packs/day: 0.50    Types: Cigarettes  . Smokeless tobacco: Never Used  Substance Use Topics  . Alcohol use: Not Currently  . Drug use: No     Allergies   Patient has no known allergies.   Review of Systems Review of Systems  Constitutional: Positive for chills. Negative for activity change, appetite change, diaphoresis, fatigue, fever and  unexpected weight change.  HENT: Negative.   Respiratory: Negative.   Cardiovascular: Negative.   Gastrointestinal: Positive for nausea. Negative for abdominal distention, abdominal pain, anal bleeding, blood in stool, constipation, diarrhea, rectal pain and vomiting.  Genitourinary: Negative.   Musculoskeletal: Negative.   Skin: Negative.   Neurological: Positive for headaches. Negative for dizziness, tremors, seizures, syncope,  facial asymmetry, speech difficulty, light-headedness and numbness.  All other systems reviewed and are negative.    Physical Exam Updated Vital Signs BP (!) 143/77   Pulse 69   Temp 98.5 F (36.9 C) (Oral)   Resp 15   Ht 5\' 11"  (1.803 m)   Wt 88.5 kg   SpO2 95%   BMI 27.20 kg/m   Physical Exam  Physical Exam  Constitutional: Pt is oriented to person, place, and time. Pt appears well-developed and well-nourished. No distress.  HENT:  Head: Normocephalic and atraumatic.  Mouth/Throat: Oropharynx is clear and moist.  Eyes: Conjunctivae and EOM are normal. Pupils are equal, round, and reactive to light. No scleral icterus.  No horizontal, vertical or rotational nystagmus  Neck: Normal range of motion. Neck supple.  Full active and passive ROM without pain No midline or paraspinal tenderness No nuchal rigidity or meningeal signs  Cardiovascular: Normal rate, regular rhythm and intact distal pulses.   Pulmonary/Chest: Effort normal and breath sounds normal. No respiratory distress. Pt has no wheezes. No rales.  Abdominal: Soft. Bowel sounds are normal. There is no tenderness. There is no rebound and no guarding.  Musculoskeletal: Normal range of motion.  Lymphadenopathy:    No cervical adenopathy.  Neurological: Pt. is alert and oriented to person, place, and time. He has normal reflexes. No cranial nerve deficit.  Exhibits normal muscle tone. Coordination normal.  Mental Status:  Alert, oriented, thought content appropriate. Speech fluent without evidence of aphasia. Able to follow 2 step commands without difficulty.  Cranial Nerves:  II:  Peripheral visual fields grossly normal, pupils equal, round, reactive to light III,IV, VI: ptosis not present, extra-ocular motions intact bilaterally  V,VII: smile symmetric, facial light touch sensation equal VIII: hearing grossly normal bilaterally  IX,X: midline uvula rise  XI: bilateral shoulder shrug equal and strong XII: midline  tongue extension  Motor:  5/5 in upper and lower extremities bilaterally including strong and equal grip strength and dorsiflexion/plantar flexion Sensory: Pinprick and light touch normal in all extremities.  Deep Tendon Reflexes: 2+ and symmetric  Cerebellar: normal finger-to-nose with bilateral upper extremities Gait: normal gait and balance CV: distal pulses palpable throughout   Skin: Skin is warm and dry. No rash noted. Pt is not diaphoretic.  Psychiatric: Pt has a normal mood and affect. Behavior is normal. Judgment and thought content normal.  Nursing note and vitals reviewed. ED Treatments / Results  Labs (all labs ordered are listed, but only abnormal results are displayed) Labs Reviewed  COMPREHENSIVE METABOLIC PANEL - Abnormal; Notable for the following components:      Result Value   Total Protein 6.3 (*)    All other components within normal limits  CBC - Abnormal; Notable for the following components:   WBC 11.5 (*)    All other components within normal limits  URINALYSIS, ROUTINE W REFLEX MICROSCOPIC - Abnormal; Notable for the following components:   Hgb urine dipstick MODERATE (*)    All other components within normal limits  LIPASE, BLOOD    EKG None  Radiology No results found.  Procedures Procedures (including critical care time)  Medications Ordered  in ED Medications  sodium chloride flush (NS) 0.9 % injection 3 mL (3 mLs Intravenous Not Given 05/17/18 2058)  ondansetron (ZOFRAN-ODT) disintegrating tablet 4 mg (4 mg Oral Given 05/17/18 2045)  sodium chloride 0.9 % bolus 1,000 mL (0 mLs Intravenous Stopped 05/17/18 2316)  metoCLOPramide (REGLAN) injection 10 mg (10 mg Intravenous Given 05/17/18 2112)  diphenhydrAMINE (BENADRYL) injection 12.5 mg (12.5 mg Intravenous Given 05/17/18 2113)  ketorolac (TORADOL) 15 MG/ML injection 15 mg (15 mg Intravenous Given 05/17/18 2113)   Initial Impression / Assessment and Plan / ED Course  I have reviewed the triage  vital signs and the nursing notes.  Pertinent labs & imaging results that were available during my care of the patient were reviewed by me and considered in my medical decision making (see chart for details).  43 year old male appears otherwise well presents for evaluation of migraine headache.  Afebrile, nonseptic, non-ill-appearing.  History of migraine headaches, states this feels similar.  Nonfocal neurologic exam without neurologic deficits.  Patient with increased stressors including recently dc from behavioral health for depression and anxiety.  States he was started on Zoloft and Atarax.  States this has helped.  Denies current SI, HI, AVH.  Denies sudden onset thunderclap headache.  No recent injuries or trauma.  Will give migraine cocktail reevaluate.  Labs ordered from triage.  On reevaluation patient states his head pain is now rated a 1/10 after migraine cocktail.  Patient able to tolerate p.o. intake in department that difficulty.  Metabolic panel without electrolyte, renal, or liver abnormalities, lipase 43, urinalysis negative for infection, CBC with mild elevation leukocytosis at 11.5, however has been elevated at baseline over multiple visits.   Pt HA treated and improved while in ED. Presentation is like pts typical HA and non concerning for [facility], ICH, Meningitis, or temporal arteritis. Pt is afebrile with no focal neuro deficits, nuchal rigidity, or change in vision. Pt is to follow up with PCP to discuss prophylactic medication.  Patient hemodynamically stable and appropriate for DC home at this time.  Pt verbalizes understanding of return precautions and is agreeable with plan to dc.     Final Clinical Impressions(s) / ED Diagnoses   Final diagnoses:  Migraine without aura and without status migrainosus, not intractable    ED Discharge Orders    None       Henderly, Britni A, PA-C 05/17/18 2335    Raeford Razor, MD 05/18/18 [phone]

## 2018-05-17 NOTE — ED Triage Notes (Signed)
Patient states that he has had a migraine since this morning. Patient is also complaining of nausea and body chills that started around 1:30 pm.

## 2018-05-17 NOTE — Discharge Instructions (Signed)
You were evaluated today for headache.  Your work-up was negative in department.   Please follow-up with PCP for reevaluation.  Return to ED for any new or worsening symptoms.

## 2018-05-18 ENCOUNTER — Other Ambulatory Visit: Payer: Self-pay

## 2018-05-18 ENCOUNTER — Emergency Department (HOSPITAL_COMMUNITY)
Admission: EM | Admit: 2018-05-18 | Discharge: 2018-05-19 | Disposition: A | Discharge status: Home / Self Care | Payer: Self-pay | Attending: Emergency Medicine | Admitting: Emergency Medicine

## 2018-05-18 ENCOUNTER — Encounter (HOSPITAL_COMMUNITY): Payer: Self-pay | Admitting: Emergency Medicine

## 2018-05-18 DIAGNOSIS — E079 Disorder of thyroid, unspecified: Secondary | ICD-10-CM | POA: Insufficient documentation

## 2018-05-18 DIAGNOSIS — F419 Anxiety disorder, unspecified: Secondary | ICD-10-CM

## 2018-05-18 DIAGNOSIS — Z79899 Other long term (current) drug therapy: Secondary | ICD-10-CM | POA: Insufficient documentation

## 2018-05-18 DIAGNOSIS — F17210 Nicotine dependence, cigarettes, uncomplicated: Secondary | ICD-10-CM | POA: Insufficient documentation

## 2018-05-18 NOTE — ED Triage Notes (Signed)
Pt states that he has been having numerous anxiety attacks, reports that he has been taking Vistaril for anxiety but recently found out his sister died and the anniversary of his mothers death. States he last took his vistaril at around 7pm. Denies SI or HI and just wants a medication adjustment.

## 2018-05-19 ENCOUNTER — Emergency Department (HOSPITAL_COMMUNITY): Payer: Self-pay

## 2018-05-19 ENCOUNTER — Encounter (HOSPITAL_COMMUNITY): Payer: Self-pay

## 2018-05-19 DIAGNOSIS — Y999 Unspecified external cause status: Secondary | ICD-10-CM | POA: Insufficient documentation

## 2018-05-19 DIAGNOSIS — X500XXA Overexertion from strenuous movement or load, initial encounter: Secondary | ICD-10-CM | POA: Insufficient documentation

## 2018-05-19 DIAGNOSIS — Y92008 Other place in unspecified non-institutional (private) residence as the place of occurrence of the external cause: Secondary | ICD-10-CM | POA: Insufficient documentation

## 2018-05-19 DIAGNOSIS — Y9389 Activity, other specified: Secondary | ICD-10-CM | POA: Insufficient documentation

## 2018-05-19 DIAGNOSIS — Z79899 Other long term (current) drug therapy: Secondary | ICD-10-CM | POA: Insufficient documentation

## 2018-05-19 DIAGNOSIS — F17210 Nicotine dependence, cigarettes, uncomplicated: Secondary | ICD-10-CM | POA: Insufficient documentation

## 2018-05-19 DIAGNOSIS — S93422A Sprain of deltoid ligament of left ankle, initial encounter: Secondary | ICD-10-CM | POA: Insufficient documentation

## 2018-05-19 NOTE — ED Triage Notes (Signed)
Pt states he was moving furniture today and slid down the last few stairs of a deck. Pt complaining of left ankle/foot pain. Pt ambulatory.

## 2018-05-19 NOTE — ED Notes (Signed)
No answer was called three time

## 2018-05-19 NOTE — ED Notes (Signed)
Patient given [place] sandwich and sprite to drink.

## 2018-05-19 NOTE — ED Provider Notes (Signed)
[facility] EMERGENCY DEPARTMENT Provider Note   CSN: [id number] Arrival date & time: 05/18/18  2126    History   Chief Complaint Chief Complaint  Patient presents with  . Anxiety    HPI Randy Webster is a 43 y.o. male.     HPI   43 year old male presents today with complaints of anxiety.  Patient notes significant anxiety attacks.  Patient was recently discharged from behavioral health put on 25 mg of hydroxyzine.  He notes he has been taking this 3/day.  Patient notes that he had several panic attacks last night with pressure in his chest rapid breathing and anxious thoughts.  He notes this is completely resolved at the time my evaluation.  Patient notes that he found out his sisters passed away which is causing more anxiety in his life.    Past Medical History:  Diagnosis Date  . Anxiety   . Depression   . Thyroid disease     Patient Active Problem List   Diagnosis Date Noted  . MDD (major depressive disorder), recurrent episode, severe (HCC) 05/09/2018    History reviewed. No pertinent surgical history.      Home Medications    Prior to Admission medications   Medication Sig Start Date End Date Taking? Authorizing Provider  hydrOXYzine (ATARAX/VISTARIL) 25 MG tablet Take 1 tablet (25 mg total) by mouth 3 (three) times daily as needed for anxiety. 05/13/18   Aldean Baker, NP  ibuprofen (ADVIL,MOTRIN) 200 MG tablet Take 800 mg by mouth daily as needed for moderate pain.    [provider]  levothyroxine (SYNTHROID, LEVOTHROID) 125 MCG tablet Take 125 mcg by mouth daily before breakfast.    [provider]  lidocaine (LIDODERM) 5 % Place 1 patch onto the skin daily. Remove & Discard patch within 12 hours or as directed by MD Patient not taking: Reported on 05/17/2018 05/14/18   Palumbo, April, MD  sertraline (ZOLOFT) 50 MG tablet Take 1 tablet (50 mg total) by mouth daily. For mood 05/14/18   Aldean Baker, NP  traZODone (DESYREL)  50 MG tablet Take 1 tablet (50 mg total) by mouth at bedtime as needed for sleep. 05/13/18   Aldean Baker, NP    Family History No family history on file.  Social History Social History   Tobacco Use  . Smoking status: Current Every Day Smoker    Packs/day: 0.50    Types: Cigarettes  . Smokeless tobacco: Never Used  Substance Use Topics  . Alcohol use: Not Currently  . Drug use: No     Allergies   Patient has no known allergies.   Review of Systems Review of Systems  All other systems reviewed and are negative.    Physical Exam Updated Vital Signs BP 136/88 (BP Location: Right Arm)   Pulse 66   Temp 98.6 F (37 C) (Oral)   Resp 14   SpO2 98%   Physical Exam Vitals signs and nursing note reviewed.  Constitutional:      Appearance: He is well-developed.  HENT:     Head: Normocephalic and atraumatic.  Eyes:     General: No scleral icterus.       Right eye: No discharge.        Left eye: No discharge.     Conjunctiva/sclera: Conjunctivae normal.     Pupils: Pupils are equal, round, and reactive to light.  Neck:     Musculoskeletal: Normal range of motion.  Vascular: No JVD.     Trachea: No tracheal deviation.  Cardiovascular:     Rate and Rhythm: Normal rate and regular rhythm.  Pulmonary:     Effort: Pulmonary effort is normal. No respiratory distress.     Breath sounds: No stridor. No wheezing, rhonchi or rales.  Chest:     Chest wall: No tenderness.  Neurological:     Mental Status: He is alert and oriented to person, place, and time.     Coordination: Coordination normal.  Psychiatric:        Behavior: Behavior normal.        Thought Content: Thought content normal.        Judgment: Judgment normal.      ED Treatments / Results  Labs (all labs ordered are listed, but only abnormal results are displayed) Labs Reviewed - No data to display  EKG None  Radiology No results found.  Procedures Procedures (including critical care  time)  Medications Ordered in ED Medications - No data to display   Initial Impression / Assessment and Plan / ED Course  I have reviewed the triage vital signs and the nursing notes.  Pertinent labs & imaging results that were available during my care of the patient were reviewed by me and considered in my medical decision making (see chart for details).        Labs:   Imaging:  Consults:  Therapeutics:  Discharge Meds:   Assessment/Plan: 43 year old male presents today with complaints of anxiety.  I will have him increase his dose of Vistaril.  He has a follow-up appointment with [facility] tomorrow.  He is discharged with return precautions.  No signs of ACS, PE, or any other acute life-threatening etiology at this time.   Final Clinical Impressions(s) / ED Diagnoses   Final diagnoses:  Anxiety    ED Discharge Orders    None       Rosalio Loud 05/19/18 3014    Maia Plan, MD 05/19/18 [phone]

## 2018-05-19 NOTE — ED Notes (Signed)
Patient verbalizes understanding of discharge instructions. Opportunity for questioning and answers were provided. Armband removed by staff, pt discharged from ED.  

## 2018-05-19 NOTE — Discharge Instructions (Addendum)
Please read attached information. If you experience any new or worsening signs or symptoms please return to the emergency room for evaluation. Please follow-up with your primary care provider or specialist as discussed. Please use medication prescribed only as directed and discontinue taking if you have any concerning signs or symptoms.   °

## 2018-05-20 ENCOUNTER — Emergency Department (HOSPITAL_COMMUNITY)
Admission: EM | Admit: 2018-05-20 | Discharge: 2018-05-20 | Disposition: A | Discharge status: Home / Self Care | Payer: Self-pay | Attending: Emergency Medicine | Admitting: Emergency Medicine

## 2018-05-20 DIAGNOSIS — S93422A Sprain of deltoid ligament of left ankle, initial encounter: Secondary | ICD-10-CM

## 2018-05-20 NOTE — ED Notes (Signed)
Pt ambulated from the lobby to room 7 without any assistance. Gait steady

## 2018-05-20 NOTE — ED Provider Notes (Signed)
[facility]-EMERGENCY DEPT Provider Note  CSN: [id number] Arrival date & time: 05/19/18 2133  Chief Complaint(s) Ankle Pain  HPI Randy Webster is a 43 y.o. male presents with several hours of left ankle pain after twisting it while walking down the steps.  Patient reports that he went outside in the rain to get a tool when he missed a step causing him to twist his left ankle.  Pain is aching throbbing he in nature.  Moderate to severe.  Exacerbated with ambulation and range of motion.  Alleviated by mobility.  Patient denies any other physical complaints  The history is provided by the patient.    Past Medical History Past Medical History:  Diagnosis Date  . Anxiety   . Depression   . Thyroid disease    Patient Active Problem List   Diagnosis Date Noted  . MDD (major depressive disorder), recurrent episode, severe (HCC) 05/09/2018   Home Medication(s) Prior to Admission medications   Medication Sig Start Date End Date Taking? Authorizing Provider  hydrOXYzine (ATARAX/VISTARIL) 25 MG tablet Take 1 tablet (25 mg total) by mouth 3 (three) times daily as needed for anxiety. 05/13/18   Aldean Baker, NP  ibuprofen (ADVIL,MOTRIN) 200 MG tablet Take 800 mg by mouth daily as needed for moderate pain.    [provider]  levothyroxine (SYNTHROID, LEVOTHROID) 125 MCG tablet Take 125 mcg by mouth daily before breakfast.    [provider]  lidocaine (LIDODERM) 5 % Place 1 patch onto the skin daily. Remove & Discard patch within 12 hours or as directed by MD Patient not taking: Reported on 05/17/2018 05/14/18   Palumbo, April, MD  sertraline (ZOLOFT) 50 MG tablet Take 1 tablet (50 mg total) by mouth daily. For mood 05/14/18   Aldean Baker, NP  traZODone (DESYREL) 50 MG tablet Take 1 tablet (50 mg total) by mouth at bedtime as needed for sleep. 05/13/18   Aldean Baker, NP                                           Past Surgical History History reviewed. No pertinent surgical history. Family History No family history on file.  Social History Social History   Tobacco Use  . Smoking status: Current Every Day Smoker    Packs/day: 0.50    Types: Cigarettes  . Smokeless tobacco: Never Used  Substance Use Topics  . Alcohol use: Not Currently  . Drug use: No   Allergies Patient has no known allergies.  Review of Systems Review of Systems All other systems are reviewed and are negative for acute change except as noted in the HPI  Physical Exam Vital Signs  I have reviewed the triage vital signs BP 129/89   Pulse 64   Temp 98.8 F (37.1 C) (Oral)   Resp 17   Ht 5\' 11"  (1.803 m)   Wt 88.5 kg   SpO2 98%   BMI 27.20 kg/m   Physical Exam Vitals signs reviewed.  Constitutional:      General: He is not in acute distress.    Appearance: He is well-developed. He is not diaphoretic.  HENT:     Head: Normocephalic and atraumatic.     Jaw: No trismus.     Right Ear: External ear normal.     Left Ear: External ear normal.     Nose: Nose  normal.  Eyes:     General: No scleral icterus.    Conjunctiva/sclera: Conjunctivae normal.  Neck:     Musculoskeletal: Normal range of motion.     Trachea: Phonation normal.  Cardiovascular:     Rate and Rhythm: Normal rate and regular rhythm.  Pulmonary:     Effort: Pulmonary effort is normal. No respiratory distress.     Breath sounds: No stridor.  Abdominal:     General: There is no distension.  Musculoskeletal: Normal range of motion.     Left ankle: He exhibits swelling. He exhibits normal range of motion, no deformity and normal pulse. Tenderness. Lateral malleolus and medial malleolus tenderness found. No head of 5th metatarsal and no proximal fibula tenderness found.       Feet:  Neurological:     Mental Status: He is alert and oriented to person, place, and time.  Psychiatric:        Behavior:  Behavior normal.     ED Results and Treatments Labs (all labs ordered are listed, but only abnormal results are displayed) Labs Reviewed - No data to display                                                                                                                       EKG  EKG Interpretation  Date/Time:    Ventricular Rate:    PR Interval:    QRS Duration:   QT Interval:    QTC Calculation:   R Axis:     Text Interpretation:        Radiology Dg Ankle Complete Left  Result Date: 05/19/2018 CLINICAL DATA:  Fall with ankle pain EXAM: LEFT ANKLE COMPLETE - 3+ VIEW COMPARISON:  05/13/2018 FINDINGS: No acute displaced fracture or malalignment. Ankle mortise is symmetric. There is soft tissue swelling IMPRESSION: No acute osseous abnormality Electronically Signed   By: Jasmine Pang M.D.   On: 05/19/2018 22:53   Pertinent labs & imaging results that were available during my care of the patient were reviewed by me and considered in my medical decision making (see chart for details).  Medications Ordered in ED Medications - No data to display                                                                                                                                  Procedures Procedures  (including critical care time)  Medical Decision Making /  ED Course I have reviewed the nursing notes for this encounter and the patient's prior records (if available in EHR or on provided paperwork).    Left ankle pain.  Plain film negative for acute fracture dislocation.  Consistent with sprain.  Provided with ASO.  The patient appears reasonably screened and/or stabilized for discharge and I doubt any other medical condition or other [facility] requiring further screening, evaluation, or treatment in the ED at this time prior to discharge.  The patient is safe for discharge with strict return precautions.   Final Clinical Impression(s) / ED Diagnoses Final diagnoses:  Sprain of deltoid  ligament of left ankle, initial encounter    Disposition: Discharge  Condition: Good  I have discussed the results, Dx and Tx plan with the patient who expressed understanding and agree(s) with the plan. Discharge instructions discussed at great length. The patient was given strict return precautions who verbalized understanding of the instructions. No further questions at time of discharge.    ED Discharge Orders    None       Follow Up: Primary care provider  Schedule an appointment as soon as possible for a visit  As needed     This chart was dictated using voice recognition software.  Despite best efforts to proofread,  errors can occur which can change the documentation meaning.   Nira Connardama, Pedro Eduardo, MD 05/20/18 [phone]

## 2018-05-20 NOTE — ED Notes (Signed)
Bed: WA07 Expected date:  Expected time:  Means of arrival:  Comments: 

## 2018-05-20 NOTE — ED Notes (Addendum)
Pt verbalized discharge instructions and follow up care. Alert and ambulatory. Ankle wrap applied and pt shown how to properly use wrap.

## 2018-05-21 ENCOUNTER — Emergency Department (HOSPITAL_COMMUNITY): Payer: Self-pay

## 2018-05-21 ENCOUNTER — Encounter (HOSPITAL_COMMUNITY): Payer: Self-pay | Admitting: Emergency Medicine

## 2018-05-21 ENCOUNTER — Emergency Department (HOSPITAL_COMMUNITY)
Admission: EM | Admit: 2018-05-21 | Discharge: 2018-05-21 | Disposition: A | Discharge status: Home / Self Care | Payer: Self-pay | Attending: Emergency Medicine | Admitting: Emergency Medicine

## 2018-05-21 ENCOUNTER — Other Ambulatory Visit: Payer: Self-pay

## 2018-05-21 DIAGNOSIS — M545 Low back pain, unspecified: Secondary | ICD-10-CM

## 2018-05-21 DIAGNOSIS — F17210 Nicotine dependence, cigarettes, uncomplicated: Secondary | ICD-10-CM | POA: Insufficient documentation

## 2018-05-21 DIAGNOSIS — Z79899 Other long term (current) drug therapy: Secondary | ICD-10-CM | POA: Insufficient documentation

## 2018-05-21 DIAGNOSIS — W19XXXA Unspecified fall, initial encounter: Secondary | ICD-10-CM

## 2018-05-21 MED ORDER — CYCLOBENZAPRINE HCL 10 MG PO TABS: 10.0000 mg | ORAL_TABLET | Freq: Three times a day (TID) | ORAL | 0 refills | Status: AC

## 2018-05-21 MED ORDER — NAPROXEN 250 MG PO TABS
500.0000 mg | ORAL_TABLET | Freq: Once | ORAL | Status: AC
  Administered 2018-05-21: 500 mg via ORAL
  Filled 2018-05-21: qty 2

## 2018-05-21 MED ORDER — NAPROXEN 500 MG PO TABS: 500.0000 mg | ORAL_TABLET | Freq: Three times a day (TID) | ORAL | 0 refills | Status: AC

## 2018-05-21 NOTE — ED Notes (Signed)
Pt refused DC vitals 

## 2018-05-21 NOTE — ED Triage Notes (Signed)
Pt fell outside in the snow and fell on his back. Pt able to stand and walk. Pt c/o bilateral low back pain

## 2018-05-21 NOTE — ED Notes (Signed)
Pt ambulated to restroom without assistance.

## 2018-05-21 NOTE — ED Notes (Signed)
Pt. returned from XR. 

## 2018-05-21 NOTE — ED Provider Notes (Signed)
[name] EMERGENCY DEPARTMENT Provider Note   CSN: [id number] Arrival date & time: 05/21/18  1950    History   Chief Complaint Chief Complaint  Patient presents with  . Fall    HPI Randy Webster is a 43 y.o. male presents to the ER for evaluation of back pain sudden onset after fall earlier today. States he was walking out on the deck but slipped on the snow. He fell backwards landing mostly on his low back.  Pain was sudden. It is constant. Throbbing. Radiating from thoracic to lumbar regions. Worse with movement, walking, bending forward.  Better with staying still. No interventions.  Reports years ago he had a fall and had similar back pain. He denies head trauma, LOC. He has no associated fever, chills, changes to BM or urine out put, loss of bladder/bowel control, saddle anesthesia.     HPI  Past Medical History:  Diagnosis Date  . Anxiety   . Depression   . Thyroid disease     Patient Active Problem List   Diagnosis Date Noted  . MDD (major depressive disorder), recurrent episode, severe (HCC) 05/09/2018    History reviewed. No pertinent surgical history.      Home Medications    Prior to Admission medications   Medication Sig Start Date End Date Taking? Authorizing Provider  cyclobenzaprine (FLEXERIL) 10 MG tablet Take 1 tablet (10 mg total) by mouth 3 (three) times daily for 5 days. 05/21/18 05/26/18  Liberty Handy, PA-C  hydrOXYzine (ATARAX/VISTARIL) 25 MG tablet Take 1 tablet (25 mg total) by mouth 3 (three) times daily as needed for anxiety. 05/13/18   Aldean Baker, NP  ibuprofen (ADVIL,MOTRIN) 200 MG tablet Take 800 mg by mouth daily as needed for moderate pain.    [provider]  levothyroxine (SYNTHROID, LEVOTHROID) 125 MCG tablet Take 125 mcg by mouth daily before breakfast.    [provider]  lidocaine (LIDODERM) 5 % Place 1 patch onto the skin daily. Remove & Discard patch within 12 hours or as directed by  MD Patient not taking: Reported on 05/17/2018 05/14/18   Palumbo, April, MD  naproxen (NAPROSYN) 500 MG tablet Take 1 tablet (500 mg total) by mouth 3 (three) times daily with meals for 5 days. 05/21/18 05/26/18  Liberty Handy, PA-C  sertraline (ZOLOFT) 50 MG tablet Take 1 tablet (50 mg total) by mouth daily. For mood 05/14/18   Aldean Baker, NP  traZODone (DESYREL) 50 MG tablet Take 1 tablet (50 mg total) by mouth at bedtime as needed for sleep. 05/13/18   Aldean Baker, NP    Family History No family history on file.  Social History Social History   Tobacco Use  . Smoking status: Current Every Day Smoker    Packs/day: 0.50    Types: Cigarettes  . Smokeless tobacco: Never Used  Substance Use Topics  . Alcohol use: Not Currently  . Drug use: No     Allergies   Patient has no known allergies.   Review of Systems Review of Systems  Musculoskeletal: Positive for back pain and myalgias.  All other systems reviewed and are negative.    Physical Exam Updated Vital Signs BP 134/80 (BP Location: Right Arm)   Pulse 69   Temp (!) 97.5 F (36.4 C) (Oral)   Resp 18   Wt 88 kg   SpO2 100%   BMI 27.06 kg/m   Physical Exam Constitutional:      General: He  is not in acute distress.    Appearance: He is well-developed.  HENT:     Head: Normocephalic and atraumatic.     Nose: Nose normal.  Neck:     Comments: c-spine: no midline or paraspinal tenderness Cardiovascular:     Rate and Rhythm: Normal rate.     Pulses:          Radial pulses are 2+ on the right side and 2+ on the left side.       Dorsalis pedis pulses are 2+ on the right side and 2+ on the left side.     Heart sounds: Normal heart sounds.  Pulmonary:     Effort: Pulmonary effort is normal.     Breath sounds: Normal breath sounds.  Abdominal:     Palpations: Abdomen is soft.     Tenderness: There is no abdominal tenderness.     Comments: No suprapubic or CVA tenderness   Musculoskeletal:         General: Tenderness present.     Lumbar back: He exhibits tenderness and pain.     Comments: T-spine: bilateral muscular tenderness. Low T spine midline tenderness.  L-spine: bilateral paraspinal muscular tenderness.  Midline L spine tenderness. Bilateral SI tenderness. Negative SLR bilaterally.  Pelvis: no pain or crepitus with IR/ER/downward pressure of hips bilaterally. No AP/L instability noted with compression. No leg shortening or rotation.    Skin:    General: Skin is warm and dry.     Capillary Refill: Capillary refill takes less than 2 seconds.     Comments: No overlaying rash to back   Neurological:     Mental Status: He is alert.     Sensory: No sensory deficit.     Comments: Can lift and hold legs without unilateral weakness or drift 5/5 strength with flexion/extension of hip, knee and ankle, bilaterally.  Sensation to light touch intact in lower extremities including feet  Psychiatric:        Behavior: Behavior normal.        Thought Content: Thought content normal.      ED Treatments / Results  Labs (all labs ordered are listed, but only abnormal results are displayed) Labs Reviewed - No data to display  EKG None  Radiology Dg Thoracic Spine 2 View  Result Date: 05/21/2018 CLINICAL DATA:  Fall, back pain. EXAM: THORACIC SPINE 2 VIEWS; LUMBAR SPINE - COMPLETE 4+ VIEW COMPARISON:  CT abdomen and pelvis August 31, 2015 and chest radiograph May 14, 2018 FINDINGS: Thoracic spine: There is no evidence of thoracic spine fracture. Schmorl's nodes. Alignment is normal. No other significant bone abnormalities are identified. Lumbar spine: Five non rib-bearing lumbar-type vertebral bodies are intact. Schmorl's nodes. No malalignment. Maintained lumbar lordosis. Moderate L3-4 and L4-5 disc height loss with mild endplate spurring is unchanged. No destructive bony lesions. Sacroiliac joints are symmetric. Included prevertebral and paraspinal soft tissue planes are non-suspicious.  Mild aortoiliac calcifications. IMPRESSION: Thoracic spine: No fracture deformity or malalignment. 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 Electronically Signed   By: Awilda Metro M.D.   On: 05/21/2018 21:44   Dg Lumbar Spine Complete  Result Date: 05/21/2018 CLINICAL DATA:  Fall, back pain. EXAM: THORACIC SPINE 2 VIEWS; LUMBAR SPINE - COMPLETE 4+ VIEW COMPARISON:  CT abdomen and pelvis August 31, 2015 and chest radiograph May 14, 2018 FINDINGS: Thoracic spine: There is no evidence of thoracic spine fracture. Schmorl's nodes. Alignment is normal. No other  significant bone abnormalities are identified. Lumbar spine: Five non rib-bearing lumbar-type vertebral bodies are intact. Schmorl's nodes. No malalignment. Maintained lumbar lordosis. Moderate L3-4 and L4-5 disc height loss with mild endplate spurring is unchanged. No destructive bony lesions. Sacroiliac joints are symmetric. Included prevertebral and paraspinal soft tissue planes are non-suspicious. Mild aortoiliac calcifications. IMPRESSION: Thoracic spine: No fracture deformity or malalignment. 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 Electronically Signed   By: Awilda Metroourtnay  Bloomer M.D.   On: 05/21/2018 21:44   Dg Ankle Complete Left  Result Date: 05/19/2018 CLINICAL DATA:  Fall with ankle pain EXAM: LEFT ANKLE COMPLETE - 3+ VIEW COMPARISON:  05/13/2018 FINDINGS: No acute displaced fracture or malalignment. Ankle mortise is symmetric. There is soft tissue swelling IMPRESSION: No acute osseous abnormality Electronically Signed   By: Jasmine PangKim  Fujinaga M.D.   On: 05/19/2018 22:53    Procedures Procedures (including critical care time)  Medications Ordered in ED Medications  naproxen (NAPROSYN) tablet 500 mg (500 mg Oral Given 05/21/18 2202)     Initial Impression / Assessment and Plan / ED Course  I have reviewed the triage  vital signs and the nursing notes.  Pertinent labs & imaging results that were available during my care of the patient were reviewed by me and considered in my medical decision making (see chart for details).        Highest on ddx is muscular strain vs spasm vs bulging disc vs radicular pain.   Strength is intact.  Sensation intact. Abdominal exam benign, without pulsatility, suprapubic or CVA tenderness. Distal pulses symmetric bilaterally. No overlaying rash. No red flag features of back pain present such as saddle anesthesia, bladder/bowel incontinence or retention, fevers, h/o cancer, IVDU, preceding trauma or falls, neuro deficits, urinary symptoms.  I have low suspicion for UTI/pyelo, kidney stone, cauda equina, epidural abscess, dissection as these don't fit clinical picture. X-rays today w/o acute bony abnormalities.  Will recommend muscle relaxer, high dose NSAIDs. Final Clinical Impressions(s) / ED Diagnoses   Final diagnoses:  Acute midline low back pain without sciatica  Fall, initial encounter    ED Discharge Orders         Ordered    cyclobenzaprine (FLEXERIL) 10 MG tablet  3 times daily     05/21/18 2203    naproxen (NAPROSYN) 500 MG tablet  3 times daily with meals     05/21/18 2203           Jerrell MylarGibbons, Ellieanna Funderburg J, PA-C 05/21/18 2235    Jacalyn LefevreHaviland, Julie, MD 05/21/18 [phone]

## 2018-05-21 NOTE — Discharge Instructions (Signed)
You were seen in the ER for back pain after a fall.  X-rays show chronic degenerative changes from wear and tear but no acute injuries.  Your pain is likely muscular.  Rest.  Light stretches and massage.  You may take naproxen as prescribed.  Use Flexeril for acute spasms of the back.  Follow-up with your primary care doctor in 7 to 10 days if the pain persists.  Return to the ER for worsening pain, abdominal pain, numbness to your groin, loss of bladder or bowel function, loss of strength to your extremities.

## 2018-05-21 NOTE — ED Notes (Signed)
Patient verbalizes understanding of discharge instructions. Opportunity for questioning and answers were provided. Armband removed by staff, pt discharged from ED.  

## 2018-05-22 ENCOUNTER — Other Ambulatory Visit: Payer: Self-pay

## 2018-05-22 ENCOUNTER — Emergency Department (HOSPITAL_COMMUNITY): Payer: Self-pay

## 2018-05-22 ENCOUNTER — Emergency Department (HOSPITAL_COMMUNITY)
Admission: EM | Admit: 2018-05-22 | Discharge: 2018-05-22 | Disposition: A | Discharge status: Home / Self Care | Payer: Self-pay | Attending: Emergency Medicine | Admitting: Emergency Medicine

## 2018-05-22 ENCOUNTER — Encounter (HOSPITAL_COMMUNITY): Payer: Self-pay | Admitting: Emergency Medicine

## 2018-05-22 DIAGNOSIS — F17210 Nicotine dependence, cigarettes, uncomplicated: Secondary | ICD-10-CM | POA: Insufficient documentation

## 2018-05-22 DIAGNOSIS — M62838 Other muscle spasm: Secondary | ICD-10-CM | POA: Insufficient documentation

## 2018-05-22 DIAGNOSIS — R079 Chest pain, unspecified: Secondary | ICD-10-CM

## 2018-05-22 LAB — POCT I-STAT TROPONIN I: Troponin i, poc: 0 ng/mL (ref 0.00–0.08)

## 2018-05-22 LAB — CBC
HCT: 44.7 % (ref 39.0–52.0)
Hemoglobin: 14.3 g/dL (ref 13.0–17.0)
MCH: 32.6 pg (ref 26.0–34.0)
MCHC: 32 g/dL (ref 30.0–36.0)
MCV: 101.8 fL — ABNORMAL HIGH (ref 80.0–100.0)
Platelets: 238 10*3/uL (ref 150–400)
RBC: 4.39 MIL/uL (ref 4.22–5.81)
RDW: 14.3 % (ref 11.5–15.5)
WBC: 9.7 10*3/uL (ref 4.0–10.5)
nRBC: 0 % (ref 0.0–0.2)

## 2018-05-22 LAB — BASIC METABOLIC PANEL
Anion gap: 5 (ref 5–15)
BUN: 12 mg/dL (ref 6–20)
CO2: 23 mmol/L (ref 22–32)
Calcium: 8.1 mg/dL — ABNORMAL LOW (ref 8.9–10.3)
Chloride: 110 mmol/L (ref 98–111)
Creatinine, Ser: 1.03 mg/dL (ref 0.61–1.24)
GFR calc Af Amer: >60 mL/min (ref >60)
Glucose, Bld: 132 mg/dL — ABNORMAL HIGH (ref 70–99)
POTASSIUM: 3.5 mmol/L (ref 3.5–5.1)
Sodium: 138 mmol/L (ref 135–145)

## 2018-05-22 MED ORDER — LIDOCAINE 5 % EX PTCH
1.0000 | MEDICATED_PATCH | CUTANEOUS | Status: DC
  Administered 2018-05-22: 1 via TRANSDERMAL
  Filled 2018-05-22: qty 1

## 2018-05-22 MED ORDER — SODIUM CHLORIDE 0.9% FLUSH: 3.0000 mL | Freq: Once | INTRAVENOUS | Status: DC

## 2018-05-22 MED ORDER — LIDOCAINE 5 % EX PTCH: 1.0000 | MEDICATED_PATCH | CUTANEOUS | 0 refills | Status: AC

## 2018-05-22 MED ORDER — ACETAMINOPHEN 500 MG PO TABS
1000.0000 mg | ORAL_TABLET | Freq: Once | ORAL | Status: AC
  Administered 2018-05-22: 1000 mg via ORAL
  Filled 2018-05-22: qty 2

## 2018-05-22 NOTE — ED Provider Notes (Addendum)
[facility]-EMERGENCY DEPT Provider Note   CSN: [id number] Arrival date & time: 05/22/18  2113    History   Chief Complaint Chief Complaint  Patient presents with  . Chest Pain    HPI Randy Webster is a 43 y.o. male.     The history is provided by the patient.  Chest Pain  Pain location:  L chest Pain quality: aching   Pain radiates to:  Does not radiate Pain severity:  Mild Onset quality:  Gradual Timing:  Intermittent Progression:  Waxing and waning Chronicity:  New Context: movement and raising an arm   Relieved by:  Nothing Worsened by:  Nothing Associated symptoms: back pain and shortness of breath   Associated symptoms: no abdominal pain, no cough, no fatigue, no fever, no lower extremity edema, no nausea, no numbness, no palpitations and no vomiting   Risk factors: no coronary artery disease, no diabetes mellitus, no high cholesterol, no hypertension and no prior DVT/PE     Past Medical History:  Diagnosis Date  . Anxiety   . Depression   . Thyroid disease     Patient Active Problem List   Diagnosis Date Noted  . MDD (major depressive disorder), recurrent episode, severe (HCC) 05/09/2018    History reviewed. No pertinent surgical history.      Home Medications    Prior to Admission medications   Medication Sig Start Date End Date Taking? Authorizing Provider  cyclobenzaprine (FLEXERIL) 10 MG tablet Take 1 tablet (10 mg total) by mouth 3 (three) times daily for 5 days. 05/21/18 05/26/18  Liberty Handy, PA-C  hydrOXYzine (ATARAX/VISTARIL) 25 MG tablet Take 1 tablet (25 mg total) by mouth 3 (three) times daily as needed for anxiety. 05/13/18   Aldean Baker, NP  ibuprofen (ADVIL,MOTRIN) 200 MG tablet Take 800 mg by mouth daily as needed for moderate pain.    [provider]  levothyroxine (SYNTHROID, LEVOTHROID) 125 MCG tablet Take 125 mcg by mouth daily before breakfast.    [provider]  lidocaine (LIDODERM) 5  % Place 1 patch onto the skin daily for 14 doses. Remove & Discard patch within 12 hours or as directed by MD 05/22/18 06/05/18  Ronson Hagins, DO  naproxen (NAPROSYN) 500 MG tablet Take 1 tablet (500 mg total) by mouth 3 (three) times daily with meals for 5 days. 05/21/18 05/26/18  Liberty Handy, PA-C  sertraline (ZOLOFT) 50 MG tablet Take 1 tablet (50 mg total) by mouth daily. For mood 05/14/18   Aldean Baker, NP  traZODone (DESYREL) 50 MG tablet Take 1 tablet (50 mg total) by mouth at bedtime as needed for sleep. 05/13/18   Aldean Baker, NP    Family History No family history on file.  Social History Social History   Tobacco Use  . Smoking status: Current Every Day Smoker    Packs/day: 0.50    Types: Cigarettes  . Smokeless tobacco: Never Used  Substance Use Topics  . Alcohol use: Not Currently  . Drug use: No     Allergies   Patient has no known allergies.   Review of Systems Review of Systems  Constitutional: Negative for chills, fatigue and fever.  HENT: Negative for ear pain and sore throat.   Eyes: Negative for pain and visual disturbance.  Respiratory: Positive for shortness of breath. Negative for cough.   Cardiovascular: Positive for chest pain. Negative for palpitations.  Gastrointestinal: Negative for abdominal pain, nausea and vomiting.  Genitourinary: Negative  for dysuria and hematuria.  Musculoskeletal: Positive for back pain. Negative for arthralgias.  Skin: Negative for color change and rash.  Neurological: Negative for seizures, syncope and numbness.  All other systems reviewed and are negative.    Physical Exam Updated Vital Signs  ED Triage Vitals [05/22/18 2120]  Enc Vitals Group     BP (!) 156/96     Pulse Rate 91     Resp 20     Temp 97.6 F (36.4 C)     Temp Source Oral     SpO2 95 %     Weight 195 lb (88.5 kg)     Height 5\' 11"  (1.803 m)     Head Circumference      Peak Flow      Pain Score      Pain Loc      Pain Edu?       Excl. in GC?     Physical Exam Vitals signs and nursing note reviewed.  Constitutional:      Appearance: He is well-developed.  HENT:     Head: Normocephalic and atraumatic.  Eyes:     Conjunctiva/sclera: Conjunctivae normal.     Pupils: Pupils are equal, round, and reactive to light.  Neck:     Musculoskeletal: Normal range of motion and neck supple.  Cardiovascular:     Rate and Rhythm: Normal rate and regular rhythm.     Pulses:          Radial pulses are 2+ on the right side and 2+ on the left side.       Dorsalis pedis pulses are 2+ on the right side and 2+ on the left side.     Heart sounds: Normal heart sounds. No murmur.  Pulmonary:     Effort: Pulmonary effort is normal. No respiratory distress.     Breath sounds: Normal breath sounds. No decreased breath sounds, wheezing, rhonchi or rales.  Chest:     Chest wall: Tenderness (TTP over left side of chest wall) present.  Abdominal:     General: Bowel sounds are normal.     Palpations: Abdomen is soft.     Tenderness: There is no abdominal tenderness.  Musculoskeletal: Normal range of motion.     Right lower leg: He exhibits no tenderness.     Left lower leg: He exhibits no tenderness.  Skin:    General: Skin is warm and dry.     Capillary Refill: Capillary refill takes less than 2 seconds.  Neurological:     General: No focal deficit present.     Mental Status: He is alert.  Psychiatric:        Mood and Affect: Mood normal.      ED Treatments / Results  Labs (all labs ordered are listed, but only abnormal results are displayed) Labs Reviewed  BASIC METABOLIC PANEL - Abnormal; Notable for the following components:      Result Value   Glucose, Bld 132 (*)    Calcium 8.1 (*)    All other components within normal limits  CBC - Abnormal; Notable for the following components:   MCV 101.8 (*)    All other components within normal limits  I-STAT TROPONIN, ED  POCT I-STAT TROPONIN I    EKG EKG  Interpretation  Date/Time:  Friday May 22 2018 21:21:12 EST Ventricular Rate:  83 PR Interval:    QRS Duration: 95 QT Interval:  368 QTC Calculation: 433 R Axis:   47  Text Interpretation:  Sinus rhythm Confirmed by Virgina NorfolkAdam, Blakley Michna [phone]([id number]) on 05/22/2018 10:14:33 PM   Radiology Dg Chest 2 View  Result Date: 05/22/2018 CLINICAL DATA:  Chest pain EXAM: CHEST - 2 VIEW COMPARISON:  05/21/2018, 05/14/2018 FINDINGS: The heart size and mediastinal contours are within normal limits. Both lungs are clear. The visualized skeletal structures are unremarkable. IMPRESSION: No active cardiopulmonary disease. Electronically Signed   By: Jasmine PangKim  Fujinaga M.D.   On: 05/22/2018 21:51   Dg Thoracic Spine 2 View  Result Date: 05/21/2018 CLINICAL DATA:  Fall, back pain. EXAM: THORACIC SPINE 2 VIEWS; LUMBAR SPINE - COMPLETE 4+ VIEW COMPARISON:  CT abdomen and pelvis August 31, 2015 and chest radiograph May 14, 2018 FINDINGS: Thoracic spine: There is no evidence of thoracic spine fracture. Schmorl's nodes. Alignment is normal. No other significant bone abnormalities are identified. Lumbar spine: Five non rib-bearing lumbar-type vertebral bodies are intact. Schmorl's nodes. No malalignment. Maintained lumbar lordosis. Moderate L3-4 and L4-5 disc height loss with mild endplate spurring is unchanged. No destructive bony lesions. Sacroiliac joints are symmetric. Included prevertebral and paraspinal soft tissue planes are non-suspicious. Mild aortoiliac calcifications. IMPRESSION: Thoracic spine: No fracture deformity or malalignment. 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 Electronically Signed   By: Awilda Metroourtnay  Bloomer M.D.   On: 05/21/2018 21:44   Dg Lumbar Spine Complete  Result Date: 05/21/2018 CLINICAL DATA:  Fall, back pain. EXAM: THORACIC SPINE 2 VIEWS; LUMBAR SPINE - COMPLETE 4+ VIEW COMPARISON:  CT abdomen and pelvis August 31, 2015 and chest radiograph  May 14, 2018 FINDINGS: Thoracic spine: There is no evidence of thoracic spine fracture. Schmorl's nodes. Alignment is normal. No other significant bone abnormalities are identified. Lumbar spine: Five non rib-bearing lumbar-type vertebral bodies are intact. Schmorl's nodes. No malalignment. Maintained lumbar lordosis. Moderate L3-4 and L4-5 disc height loss with mild endplate spurring is unchanged. No destructive bony lesions. Sacroiliac joints are symmetric. Included prevertebral and paraspinal soft tissue planes are non-suspicious. Mild aortoiliac calcifications. IMPRESSION: Thoracic spine: No fracture deformity or malalignment. 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 Electronically Signed   By: Awilda Metroourtnay  Bloomer M.D.   On: 05/21/2018 21:44    Procedures Procedures (including critical care time)  Medications Ordered in ED Medications  sodium chloride flush (NS) 0.9 % injection 3 mL (has no administration in time range)  lidocaine (LIDODERM) 5 % 1 patch (has no administration in time range)  acetaminophen (TYLENOL) tablet 1,000 mg (has no administration in time range)     Initial Impression / Assessment and Plan / ED Course  I have reviewed the triage vital signs and the nursing notes.  Pertinent labs & imaging results that were available during my care of the patient were reviewed by me and considered in my medical decision making (see chart for details).        Vella KohlerJames Campau is a 43 year old male with history of anxiety, depression who presents to the ED with chest wall pain.  Patient with normal vitals.  No fever.  Patient had fall yesterday injuring his upper back.  Was prescribed muscle relaxant, states that he continues to have upper back pain, frontal chest pain, shortness of breath at times.  Patient has reproducible tenderness on exam to his upper chest and upper back.  Normal pulses throughout.  No concern for dissection.   Likely musculoskeletal pain.  Patient with no cardiac risk factors.  EKG shows sinus rhythm.  No ischemic changes.  Troponin normal. Atypical story and doubt ACS. Patient is PERC negative and doubt PE.  Chest x-ray showed no signs of pneumonia, pneumothorax, pleural effusion.  No significant anemia, electrolyte abnormality, kidney injury.  Patient given lidocaine patch, Tylenol.  Recommend continued use of Motrin which he has been underdosing.  Recommend that he use Tylenol as well.  Given prescription for lidocaine patch and discharged from the ED in good condition.  This chart was dictated using voice recognition software.  Despite best efforts to proofread,  errors can occur which can change the documentation meaning.   Final Clinical Impressions(s) / ED Diagnoses   Final diagnoses:  Nonspecific chest pain  Muscle spasm    ED Discharge Orders         Ordered    lidocaine (LIDODERM) 5 %  Every 24 hours     05/22/18 2224           Virgina NorfolkCuratolo, Taja Pentland, DO 05/22/18 2231    Virgina Norfolkuratolo, Avleen Bordwell, DO 05/22/18 2232

## 2018-05-22 NOTE — ED Triage Notes (Signed)
Patient c/o sharp central chest pain with SOB x1 hour.

## 2018-05-22 NOTE — ED Notes (Signed)
PT DISCHARGED. INSTRUCTIONS AND PRESCRIPTION GIVEN. AAOX4. PT IN NO APPARENT DISTRESS WITH MILD PAIN. THE OPPORTUNITY TO ASK QUESTIONS WAS PROVIDED. 

## 2018-05-25 ENCOUNTER — Encounter (HOSPITAL_COMMUNITY): Payer: Self-pay | Admitting: Emergency Medicine

## 2018-05-25 ENCOUNTER — Emergency Department (HOSPITAL_COMMUNITY)
Admission: EM | Admit: 2018-05-25 | Discharge: 2018-05-26 | Disposition: A | Discharge status: Home / Self Care | Payer: Self-pay | Attending: Emergency Medicine | Admitting: Emergency Medicine

## 2018-05-25 DIAGNOSIS — R519 Headache, unspecified: Secondary | ICD-10-CM

## 2018-05-25 DIAGNOSIS — R51 Headache: Secondary | ICD-10-CM | POA: Insufficient documentation

## 2018-05-25 DIAGNOSIS — Z79899 Other long term (current) drug therapy: Secondary | ICD-10-CM | POA: Insufficient documentation

## 2018-05-25 DIAGNOSIS — F17210 Nicotine dependence, cigarettes, uncomplicated: Secondary | ICD-10-CM | POA: Insufficient documentation

## 2018-05-25 MED ORDER — KETOROLAC TROMETHAMINE 15 MG/ML IJ SOLN
15.0000 mg | Freq: Once | INTRAMUSCULAR | Status: AC
  Administered 2018-05-25: 15 mg via INTRAVENOUS
  Filled 2018-05-25: qty 1

## 2018-05-25 MED ORDER — PROCHLORPERAZINE EDISYLATE 10 MG/2ML IJ SOLN
10.0000 mg | Freq: Once | INTRAMUSCULAR | Status: AC
  Administered 2018-05-25: 10 mg via INTRAVENOUS
  Filled 2018-05-25: qty 2

## 2018-05-25 NOTE — ED Provider Notes (Signed)
[name] EMERGENCY DEPARTMENT Provider Note   CSN: [id number] Arrival date & time: 05/25/18  2141    History   Chief Complaint Chief Complaint  Patient presents with  . Headache    HPI Randy Webster is a 43 y.o. male with a hx of anxiety, depression, recurrent migraines, homelessness, tobacco abuse, and hypothyroidism who presents to the ED with complaints of headache that began at 1800. Patient states headache is located in the frontal area, it began gradually & progressively worsened. Current pain is an 8/10 in severity, worse with bright light, no alleviating factors. Tried motrin without relief. Has had some mild nausea. Feels similar to prior migraines. Denies change in vision, dizziness, numbness, weakness, fever, chills, neck stiffness, vomiting, or head injury.   Last seen in the ER for migraine 02/16, improved w/ migraine cocktail     HPI  Past Medical History:  Diagnosis Date  . Anxiety   . Depression   . Thyroid disease     Patient Active Problem List   Diagnosis Date Noted  . MDD (major depressive disorder), recurrent episode, severe (HCC) 05/09/2018    History reviewed. No pertinent surgical history.      Home Medications    Prior to Admission medications   Medication Sig Start Date End Date Taking? Authorizing Provider  cyclobenzaprine (FLEXERIL) 10 MG tablet Take 1 tablet (10 mg total) by mouth 3 (three) times daily for 5 days. 05/21/18 05/26/18  Liberty Handy, PA-C  hydrOXYzine (ATARAX/VISTARIL) 25 MG tablet Take 1 tablet (25 mg total) by mouth 3 (three) times daily as needed for anxiety. 05/13/18   Aldean Baker, NP  ibuprofen (ADVIL,MOTRIN) 200 MG tablet Take 800 mg by mouth daily as needed for moderate pain.    [provider]  levothyroxine (SYNTHROID, LEVOTHROID) 125 MCG tablet Take 125 mcg by mouth daily before breakfast.    [provider]  lidocaine (LIDODERM) 5 % Place 1 patch onto the skin daily for 14  doses. Remove & Discard patch within 12 hours or as directed by MD 05/22/18 06/05/18  Curatolo, Adam, DO  naproxen (NAPROSYN) 500 MG tablet Take 1 tablet (500 mg total) by mouth 3 (three) times daily with meals for 5 days. 05/21/18 05/26/18  Liberty Handy, PA-C  sertraline (ZOLOFT) 50 MG tablet Take 1 tablet (50 mg total) by mouth daily. For mood 05/14/18   Aldean Baker, NP  traZODone (DESYREL) 50 MG tablet Take 1 tablet (50 mg total) by mouth at bedtime as needed for sleep. 05/13/18   Aldean Baker, NP    Family History No family history on file.  Social History Social History   Tobacco Use  . Smoking status: Current Every Day Smoker    Packs/day: 0.50    Types: Cigarettes  . Smokeless tobacco: Never Used  Substance Use Topics  . Alcohol use: Not Currently  . Drug use: No     Allergies   Patient has no known allergies.   Review of Systems Review of Systems  Constitutional: Negative for chills and fever.  Eyes: Negative for visual disturbance.  Gastrointestinal: Positive for nausea. Negative for abdominal pain and vomiting.  Neurological: Positive for headaches. Negative for dizziness, tremors, seizures, syncope, facial asymmetry, speech difficulty, weakness and numbness.  All other systems reviewed and are negative.    Physical Exam Updated Vital Signs BP 130/73 (BP Location: Right Arm)   Pulse 93   Temp 98 F (36.7 C) (Oral)   Resp  16   Ht 5\' 11"  (1.803 m)   Wt 88.5 kg   SpO2 97%   BMI 27.20 kg/m   Physical Exam Vitals signs and nursing note reviewed.  Constitutional:      General: He is not in acute distress.    Appearance: He is well-developed. He is not toxic-appearing.  HENT:     Head: Normocephalic and atraumatic.  Eyes:     General:        Right eye: No discharge.        Left eye: No discharge.     Extraocular Movements: Extraocular movements intact.     Conjunctiva/sclera: Conjunctivae normal.     Pupils: Pupils are equal, round, and reactive  to light.     Comments: No proptosis.   Neck:     Musculoskeletal: Neck supple.  Cardiovascular:     Rate and Rhythm: Normal rate and regular rhythm.  Pulmonary:     Effort: Pulmonary effort is normal. No respiratory distress.     Breath sounds: Normal breath sounds. No wheezing, rhonchi or rales.  Abdominal:     General: There is no distension.     Palpations: Abdomen is soft.     Tenderness: There is no abdominal tenderness.  Skin:    General: Skin is warm and dry.     Findings: No rash.  Neurological:     Mental Status: He is alert.     Comments: Alert. Clear speech. No facial droop. CNIII-XII grossly intact. Bilateral upper and lower extremities' sensation grossly intact. 5/5 symmetric strength with grip strength and with plantar and dorsi flexion bilaterally. Normal finger to nose bilaterally. Negative pronator drift. Negative Romberg sign. Gait is steady and intact.    Psychiatric:        Behavior: Behavior normal.    ED Treatments / Results  Labs (all labs ordered are listed, but only abnormal results are displayed) Labs Reviewed - No data to display  EKG None  Radiology No results found.  Procedures Procedures (including critical care time)  Medications Ordered in ED Medications  prochlorperazine (COMPAZINE) injection 10 mg (has no administration in time range)  ketorolac (TORADOL) 15 MG/ML injection 15 mg (has no administration in time range)     Initial Impression / Assessment and Plan / ED Course  I have reviewed the triage vital signs and the nursing notes.  Pertinent labs & imaging results that were available during my care of the patient were reviewed by me and considered in my medical decision making (see chart for details).    Patient presents with complaint of headache. Patient is nontoxic appearing with stable vital signs. Patient has hx of similar headaches, gradual onset with steady progression in severity- non concerning for [facility], [facility], ischemic  CVA, dural venous sinus thrombosis, acute glaucoma, giant cell arteritis, mass, or meningitis. Pt is afebrile with no focal neuro deficits, dizziness, change in vision, proptosis, or nuchal rigidity. Patient treated for headache with migraine cocktail with improvement, feels comfortable w/ discharge at this time. I discussed treatment plan, need for PCP follow-up, and return precautions with the patient. Provided opportunity for questions, patient confirmed understanding and is in agreement with plan.   Final Clinical Impressions(s) / ED Diagnoses   Final diagnoses:  Acute nonintractable headache, unspecified headache type    ED Discharge Orders    None       Cherly Anderson, PA-C 05/25/18 2344    Pricilla Loveless, MD 05/29/18 1620

## 2018-05-25 NOTE — ED Triage Notes (Signed)
Pt reports HA X1 day. Tried OTC meds with no relief. Ambulatory, Homeless.

## 2018-05-25 NOTE — Discharge Instructions (Addendum)
You were seen in the ER today for a headache. Please take motrin/tylenol for headache per over the counter dosing. Please follow up with primary care within 3 days. Return to the ER for new or worsening symptoms including but not limited to worsened pain, change in pain quality, change in vision, vomiting, fever, numbness, weakness, passing out, seizure activity, or any other concerns.

## 2019-03-09 IMAGING — CR DG ANKLE COMPLETE 3+V*L*
3 series · 3 of 3 positions shown · non-contrast
Comparison: None.

CLINICAL DATA: Twisting injury.  Anterior and medial pain

EXAM:
LEFT ANKLE COMPLETE - 3+ VIEW

[ankle ap]
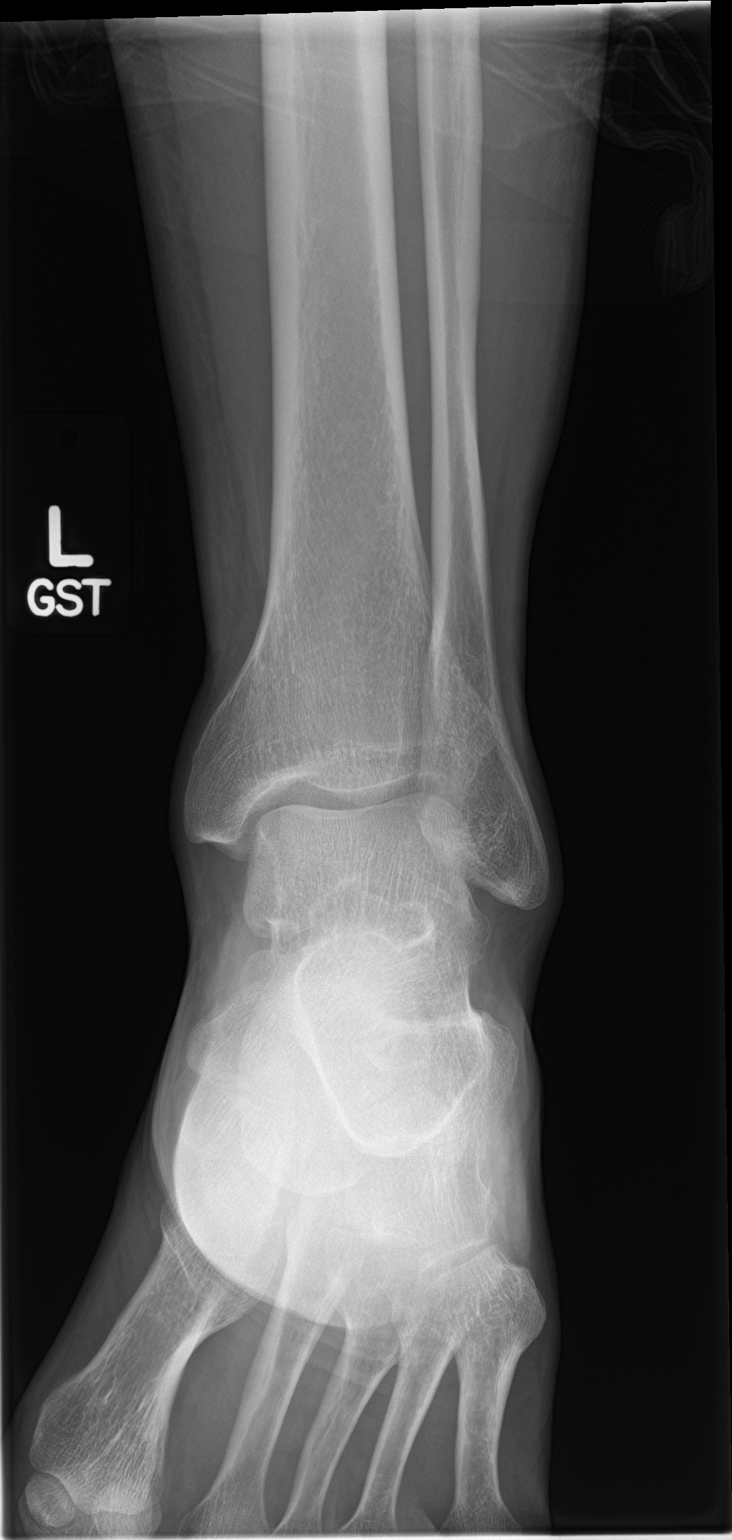

[ankle obl]
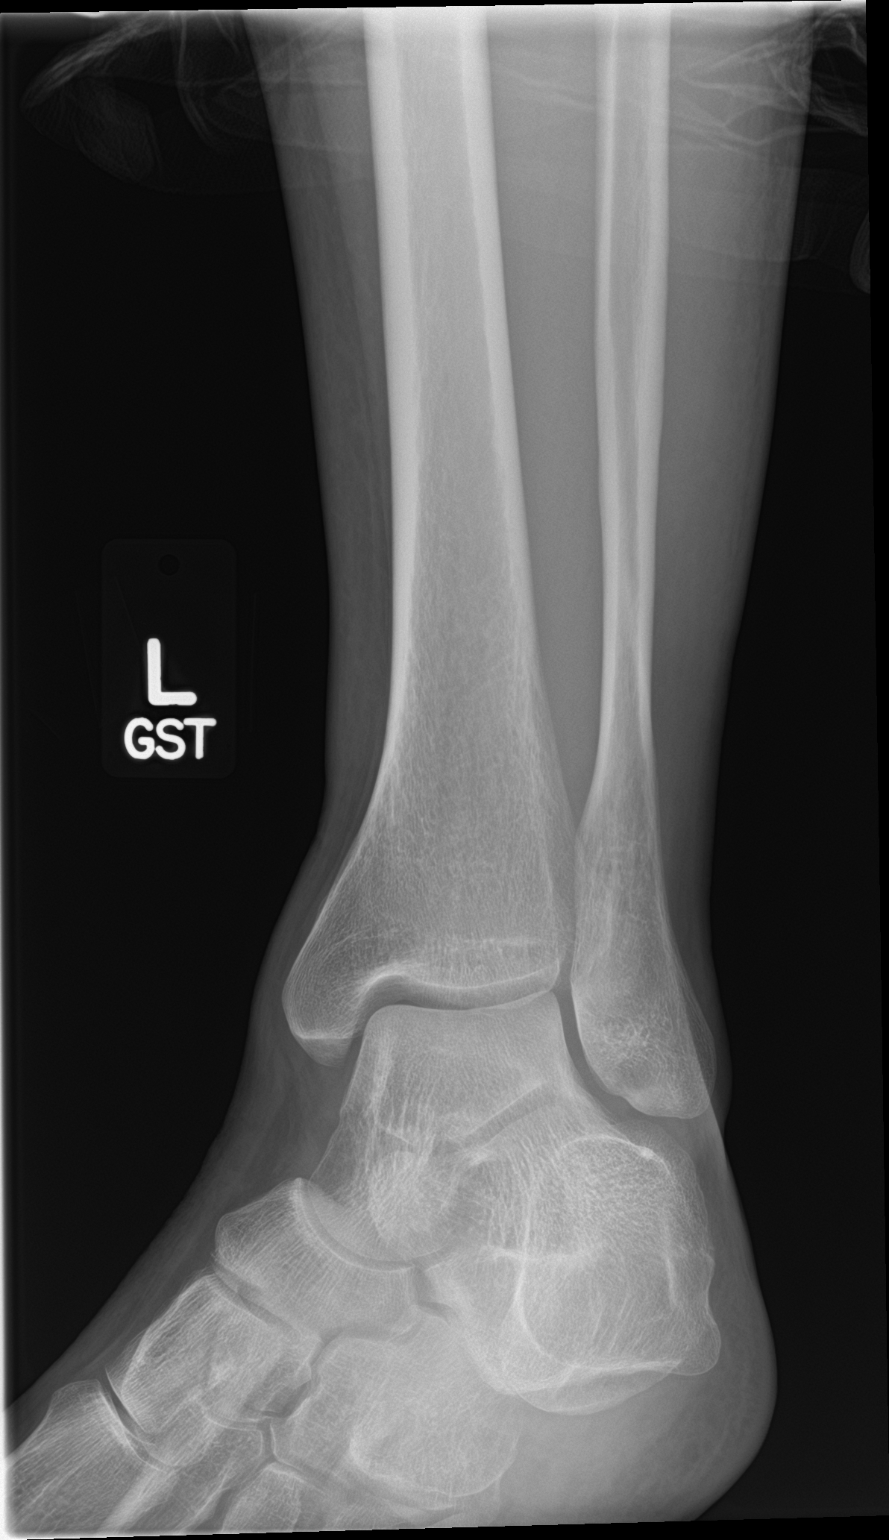

[ankle lat]
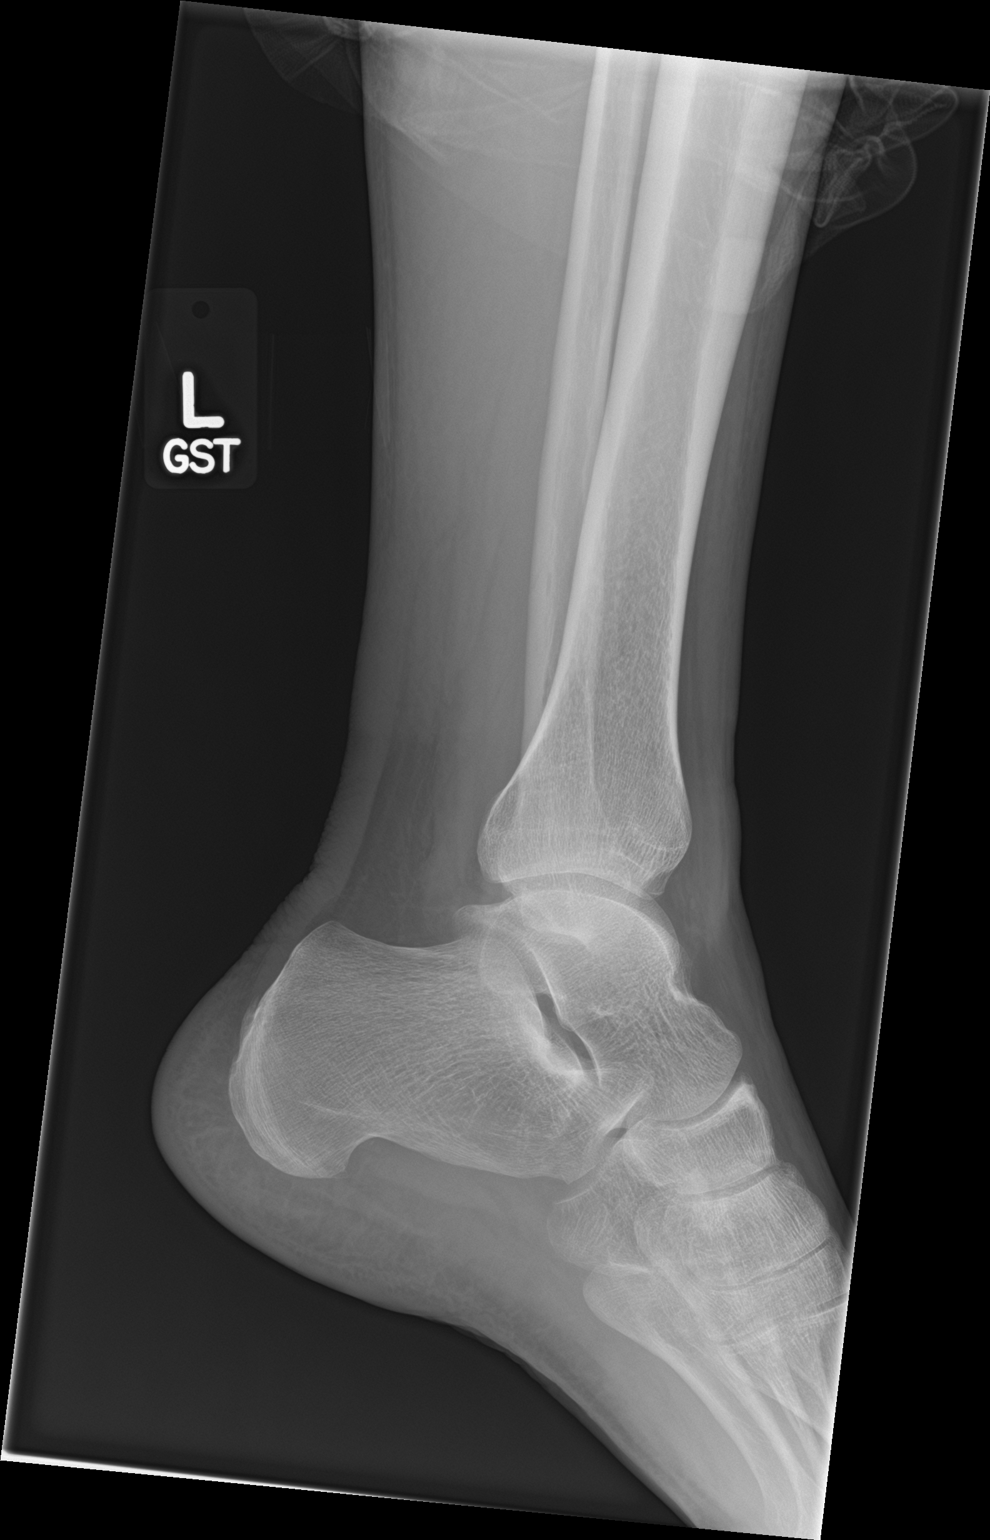

[3 of 3 positions shown; findings below may reference images not displayed]

FINDINGS: There is no evidence of fracture, dislocation, or joint effusion.
There is no evidence of arthropathy or other focal bone abnormality.
Soft tissues are unremarkable.
IMPRESSION: Negative.

## 2022-12-01 DEATH — deceased
